# Patient Record
Sex: Female | Born: 1970 | Race: Black or African American | Hispanic: No | Marital: Single | Smoking: Never smoker
Health system: Southern US, Community
[De-identification: ages and names within clinical notes are randomized; demographics above are authoritative.]

## PROBLEM LIST (undated history)

## (undated) DIAGNOSIS — J45909 Unspecified asthma, uncomplicated: Secondary | ICD-10-CM

## (undated) DIAGNOSIS — K219 Gastro-esophageal reflux disease without esophagitis: Secondary | ICD-10-CM

## (undated) DIAGNOSIS — L659 Nonscarring hair loss, unspecified: Secondary | ICD-10-CM

## (undated) DIAGNOSIS — F419 Anxiety disorder, unspecified: Secondary | ICD-10-CM

## (undated) DIAGNOSIS — G4730 Sleep apnea, unspecified: Secondary | ICD-10-CM

## (undated) HISTORY — PX: MASTECTOMY: SHX3

## (undated) HISTORY — PX: KNEE SURGERY: SHX244

## (undated) HISTORY — DX: Nonscarring hair loss, unspecified: L65.9

## (undated) HISTORY — DX: Gastro-esophageal reflux disease without esophagitis: K21.9

## (undated) HISTORY — DX: Unspecified asthma, uncomplicated: J45.909

## (undated) HISTORY — DX: Anxiety disorder, unspecified: F41.9

---

## 2001-04-18 ENCOUNTER — Other Ambulatory Visit: Admission: RE | Admit: 2001-04-18 | Discharge: 2001-04-18 | Payer: Self-pay | Admitting: *Deleted

## 2001-10-31 HISTORY — PX: MYOMECTOMY: SHX85

## 2002-03-04 ENCOUNTER — Other Ambulatory Visit: Admission: RE | Admit: 2002-03-04 | Discharge: 2002-03-04 | Payer: Self-pay | Admitting: *Deleted

## 2002-03-06 ENCOUNTER — Encounter: Admission: RE | Admit: 2002-03-06 | Discharge: 2002-03-06 | Payer: Self-pay | Admitting: *Deleted

## 2002-03-06 ENCOUNTER — Encounter: Payer: Self-pay | Admitting: *Deleted

## 2002-10-09 ENCOUNTER — Encounter (INDEPENDENT_AMBULATORY_CARE_PROVIDER_SITE_OTHER): Payer: Self-pay

## 2002-10-09 ENCOUNTER — Inpatient Hospital Stay (HOSPITAL_COMMUNITY): Admission: RE | Admit: 2002-10-09 | Discharge: 2002-10-11 | Payer: Self-pay | Admitting: Obstetrics and Gynecology

## 2002-10-18 ENCOUNTER — Encounter: Payer: Self-pay | Admitting: Emergency Medicine

## 2002-10-18 ENCOUNTER — Emergency Department (HOSPITAL_COMMUNITY): Admission: EM | Admit: 2002-10-18 | Discharge: 2002-10-18 | Payer: Self-pay | Admitting: Emergency Medicine

## 2003-04-11 ENCOUNTER — Other Ambulatory Visit: Admission: RE | Admit: 2003-04-11 | Discharge: 2003-04-11 | Payer: Self-pay | Admitting: Obstetrics and Gynecology

## 2004-07-02 ENCOUNTER — Encounter: Admission: RE | Admit: 2004-07-02 | Discharge: 2004-07-02 | Payer: Self-pay | Admitting: Obstetrics and Gynecology

## 2005-07-28 ENCOUNTER — Encounter: Admission: RE | Admit: 2005-07-28 | Discharge: 2005-07-28 | Payer: Self-pay | Admitting: Obstetrics and Gynecology

## 2006-08-24 ENCOUNTER — Encounter: Admission: RE | Admit: 2006-08-24 | Discharge: 2006-08-24 | Payer: Self-pay | Admitting: Obstetrics and Gynecology

## 2009-12-08 ENCOUNTER — Emergency Department (HOSPITAL_COMMUNITY): Admission: EM | Admit: 2009-12-08 | Discharge: 2009-12-08 | Payer: Self-pay | Admitting: Family Medicine

## 2011-01-20 LAB — GLUCOSE, CAPILLARY: Glucose-Capillary: 90 mg/dL (ref 70–99)

## 2011-01-20 LAB — POCT I-STAT, CHEM 8
BUN: 8 mg/dL (ref 6–23)
Calcium, Ion: 1.16 mmol/L (ref 1.12–1.32)
Creatinine, Ser: 0.9 mg/dL (ref 0.4–1.2)
HCT: 42 % (ref 36.0–46.0)
Hemoglobin: 14.3 g/dL (ref 12.0–15.0)
Potassium: 3.9 mEq/L (ref 3.5–5.1)

## 2011-01-20 LAB — POCT PREGNANCY, URINE: Preg Test, Ur: NEGATIVE

## 2011-03-18 NOTE — H&P (Signed)
NAME:  Allison Garza, Allison Garza                 ACCOUNT NO.:  [id number]   MEDICAL RECORD NO.:  [id number]                   PATIENT TYPE:  INP   LOCATION:  NA                                   FACILITY:  [facility]   PHYSICIAN:  Maxie Better, M.D.            DATE OF BIRTH:  01/10/1971   DATE OF ADMISSION:  10/09/2002  DATE OF DISCHARGE:                                HISTORY & PHYSICAL   CHIEF COMPLAINT:  Heavy menses, fibroid uterus.   HISTORY OF PRESENT ILLNESS:  This is a 40 year old gravida 1, para 0-0-1-0,  single black female whose last menstrual period is 09/22/02, who with  uterine fibroids is now being admitted for myomectomy secondary to  menorrhagia.  The patient had been having cycles every two weeks, each cycle  lasting seven days with heavy flow the third through the sixth day.  She  underwent ultrasound on 05/17/02, which showed several fibroids, the largest  of which was 3.9 cm, which was posterior fundal.  So __________ component  could not be ruled out.  Therefore, a sonohysterogram infusion showed a  large submucosal fibroid, normal left ovary, small right simple ovarian  cyst, with several other fibroids in the intramural and subserosal area as  well as the posterior low uterine segment area.  The patient was given  counseling regarding her options, including hysteroscopic resection of the  submucosal fibroid, and the patient prefers to proceed with myomectomy.  Information was also given to her regarding uterine artery embolization.   PAST MEDICAL HISTORY:  Depression.   PAST SURGICAL HISTORY:  D&C, left ACL repair.   ALLERGIES:  SHELLFISH/CLAMS.   FAMILY HISTORY:  Positive for hypertension, breast cancer in paternal  grandmother, maternal grandmother, and paternal aunt.  Mother and sister  with fibroids.   PAST OBSTETRICAL HISTORY:  TAB x1.   SOCIAL HISTORY:  Single.  Clinical research associate.  Nonsmoker.   REVIEW OF SYSTEMS:  Negative except as noted in the history of  present  illness.   PHYSICAL EXAMINATION:  GENERAL:  A well-developed, well-nourished black  female in no acute distress.  VITAL SIGNS:  Blood pressure 110/60, pulse of 70, weight is 170 pounds.  SKIN:  Only a small sebaceous cyst on the right cheek.  NECK:  Supple, no palpable mass.  CHEST:  Lungs were clear to auscultation.  BREASTS:  Soft, nontender, no palpable mass.  CARDIAC:  Regular rate and rhythm without murmur.  LYMPHATIC:  No axillary, supraclavicular, or inguinal node palpable.  ABDOMEN:  Soft, nontender.  PELVIC:  Vulva showed no lesions.  Vagina had no discharge.  Cervix was  closed.  Uterus was slight about 10-12 weeks' size, anterior.  Adnexa with  no palpable mass but limited by the uterine size.  RECTAL:  Deferred.  EXTREMITIES:  No edema.   IMPRESSION:  Symptomatic uterine fibroids.   PLAN:  Admission, antibiotic prophylaxis, exploratory laparotomy and  myomectomy.  Risks of the procedure, including but not limited to  infection,  bleeding, possible need for C-section for future deliveries, up to 30%  chance of needing the same surgery or a hysterectomy in the future, internal  scar tissue which may result in pelvic pain and secondary infertility,  possible inability to perform a myomectomy given the finding of  adneomyoma/adenomyosis as the actual etiology for the ultrasound findings,  possible need for blood transfusion with the inherent risks noted including  the risk of infection with hepatitis, HIV transmission, other infections  which may or may not be seen at this time with routine screening of the  blood, a 2% chance of a hysterectomy.  Postop care and criteria for  discharge were reviewed.  Antiembolic stockings.  All questions answered.                                               Maxie Better, M.D.    [place]/MEDQ  D:  10/08/2002  T:  10/09/2002  Job:  [id number]

## 2011-03-18 NOTE — Op Note (Signed)
NAME:  Allison Garza, Allison Garza                 ACCOUNT NO.:  [id number]   MEDICAL RECORD NO.:  [id number]                   PATIENT TYPE:  INP   LOCATION:  9312                                 FACILITY:  [facility]   PHYSICIAN:  Maxie Better, M.D.            DATE OF BIRTH:  [birth year]   DATE OF PROCEDURE:  10/09/2002  DATE OF DISCHARGE:                                 OPERATIVE REPORT   PREOPERATIVE DIAGNOSES:  1. Menometrorrhagia.  2. Uterine fibroids.   POSTOPERATIVE DIAGNOSES:  1. Submucosal/intramural/subserosal fibroids.  2. Menometrorrhagia.   PROCEDURE:  Examination under anesthesia, exploratory laparotomy, multiple  myomectomy.   SURGEON:  Maxie Better, M.D.   ASSISTANT:  Cordelia Pen A. Rosalio Macadamia, M.D.   ANESTHESIA:  General.   INDICATIONS:  This is a 40 year old gravida 1, para 0-0-1-0 single black  female, last menstrual period was September 22, 2002, with symptomatic  uterine fibroids who is now being admitted for a myomectomy.  The risks and  benefits of the procedure have been explained to the patient.  Consent was  signed.  The patient was transferred to the operating room.   DESCRIPTION OF PROCEDURE:  Under adequate general anesthesia, the patient  was placed in the supine position.  Examination under anesthesia revealed an  irregular anteverted 10-12 week size uterus.  No adnexal masses could be  appreciated.  The patient was placed in the frogleg position.  She was  abdominally and vaginally prepped with Hibiclens solution.  An indwelling  Foley catheter was placed sterilely.  A bivalve speculum was placed in the  vagina.  A single-tooth tenaculum was placed on the anterior lip of the  cervix.  An acorn cannula primed with indigo carmine diluted solution was  inserted into the cervical os and attached to the tenaculum for manipulation  of the uterus.  The bivalve speculum was removed.  The patient as then  sterilely draped.  A Pfannenstiel skin incision  was then made after 0.25%  Marcaine was injected along the planned incision line.  The incision was  carried down to the rectus fascia.  The rectus fascia was incised in the  midline with Bovie cautery and extended bilaterally.  The rectus fascia was  then bluntly and with cautery dissected off the rectus muscles, superior and  inferior fascia.  The rectus muscles were split in the midline.  The  parietal peritoneum was entered sharply and extended superiorly and  inferiorly.  Exploration of the upper abdomen was notable for a normal  palpable liver edge, normal palpable kidneys.  Attention was then turned to  the pelvis where the uterus was examined and had multiple fibroids making  the uterus about 10 weeks' size.  Both ovaries were normal.  The tubes were  both tortuous, left greater than right with a question of salpingitis,  isthmica nodosa.  The bowels were packed upwardly.  A self-retaining  retractor was then placed, and a pediatric bladder blade was then utilized.  Further  inspection of the uterus was notable for three palpable anterior  soft serosal fibroids noted posteriorly.  On both cornual regions were  fibroids ranging from 3-4 cm.  In addition on the left lower uterine segment  posteriorly was at least a 3 cm fibroid.  There were other pea-sized small  pedunculated fibroids noted posteriorly and anteriorly.  A dilute solution  of Pitressin was injected overlying the posterior serosa of the fibroid  posteriorly.  Using needle point cautery vertical incision was made over the  right posterior cornual fibroid.  This was then enucleated from its base,  and the methylene blue/indigo carmine fluid was injected into the uterus and  confirmed that the endometrial cavity was entered at that point.  No  additional fibroids could be palpated in the base of that dead space from  the removal of the fibroid, and the area overlying the endometrial tissue  was closed with 3-0 Vicryl suture  in a running stitch, and the other  remaining areas closed with 0 Vicryl.  The serosa of the dead space was then  closed with 2-0 Monocryl suture in a baseball fashion.  Attention was then  turned to the additional posterior fibroids, and again incision was made  overlying the fibroids.  They were enucleated from their base and were  closed with 0 Vicryl suture until the serosa of the uterus was reached and  then 2-0 Monocryl was then utilized.  Attention was then turned to the  anterior fibroids.  Again, Pitressin was injected.  A small vertical  incision was then made between both fibroids and through that incision four  fibroids were then removed.  The dead space was then closed with 0 Vicryl  figure-of-eight sutures and the serosa again approximated using 2-0 Monocryl  suture.  Both ovaries were normal.  The right fallopian tube had small  paratubal cysts which were not removed.  The uterine cavity was again  injected with the dye solution to see what the status of the fallopian tubes  were.  However, the dye was coming through the previous closed myoma space  on the right posterior area, and therefore, the procedure was not continued.  The abdomen was then irrigated and suctioned of debris.  The packings were  then removed.  The Interceed was then placed posteriorly and anteriorly  overlying the incision sites and the retaininig retractor was removed.  The  rectus muscles were inspected.  Small bleeders cauterized.  The rectus  fascia was then closed with 0 Vicryl x2.  The subcutaneous area was  irrigated and suctioned.  Small bleeders were cauterized.  The skin was  approximated using 4-0 Monocryl subcuticular stitch and Dermabond.  Specimen  was myoma x8.  Estimated blood loss was 100 cc.  Intraoperative fluids were  2400 cc crystalloid.  Urine output was 350 cc of bluish tinged urine.  Sponge and instrument counts x2 were correct.  The instruments from the vagina were removed.   Complications were none.  The patient tolerated the  procedure well and was transferred to the recovery room in stable condition.  She will need a cesarean section for future deliveries.                                               Maxie Better, M.D.    [place]/MEDQ  D:  10/09/2002  T:  10/09/2002  Job:  [id number]

## 2011-03-18 NOTE — Discharge Summary (Signed)
   NAME:  Allison Garza, Allison Garza                 ACCOUNT NO.:  [id number]   MEDICAL RECORD NO.:  [id number]                  PATIENT TYPE:  INP   LOCATION:  9312                                 FACILITY:  [facility]   PHYSICIAN:  Maxie Better, M.D.            DATE OF BIRTH:  [birth year]   DATE OF ADMISSION:  10/09/2002  DATE OF DISCHARGE:  10/11/2002                                 DISCHARGE SUMMARY   ADMISSION DIAGNOSES:  1. Menometrorrhagia.  2. Uterine fibroids.   DISCHARGE DIAGNOSES:  1. Menometrorrhagia.  2. Submucosal/intramural/subserosal fibroids.  3. Iron-deficiency anemia.   PROCEDURES:  1. Exploratory laparotomy.  2. Multiple myomectomy.   HISTORY OF PRESENT ILLNESS:  This is a 40 year old gravida 1, para 0-0-0-1,  female with symptomatic uterine fibroids admitted for surgical management.   HOSPITAL COURSE:  The patient was admitted on October 09, 2002.  She was  taken to the operating room where she underwent an exploratory laparotomy  and multiple myomectomy.  A total of eight fibroids were removed.  The  initial findings were possible salpingitis, isthmica nodosa of the left,  greater than right, tube and right paratubal cyst.  The endometrial cavity  was entered so this patient is a cesarean section for future deliveries.   She had an uncomplicated postoperative course.  She had a temperature  maximum of 100.5 which subsequently defervesced spontaneously.  Her CBC on  postoperative day #1 showed hemoglobin 9, hematocrit 27, white count 8.5,  platelet count 273,000.  Her preoperative hemoglobin was 10.9.   By postoperative day #2, the patient was tolerating a regular diet and  deemed well to be discharged home.  Her incision had no erythema,  induration, or exudate.   DISPOSITION:  Home.   CONDITION ON DISCHARGE:  Stable.   FOLLOWUP:  In four weeks at [facility].   DISCHARGE INSTRUCTIONS:  1. Call for a temperature greater or equal  to 100.4.  2. Nothing per vagina for 4-6 weeks.  3. No heavy lifting or driving for two weeks.  4. Call for abdominal pain, nausea and vomiting, heavy bleeding per vagina,     incisional bleeding or drainage, or increased incisional pain.   DISCHARGE MEDICATIONS:  1. Tylox 25 mg 1-2 tablets q.3-4 h. p.r.n. pain.  2. Motrin 800 mg one p.o. q.6-8 h.  3. Ferrous sequel one p.o. daily.                                               Maxie Better, M.D.    /MEDQ  D:  11/29/2002  T:  11/29/2002  Job:  [id number]

## 2013-03-29 DIAGNOSIS — F419 Anxiety disorder, unspecified: Secondary | ICD-10-CM | POA: Insufficient documentation

## 2013-03-29 DIAGNOSIS — R0989 Other specified symptoms and signs involving the circulatory and respiratory systems: Secondary | ICD-10-CM | POA: Insufficient documentation

## 2013-03-29 DIAGNOSIS — F32A Depression, unspecified: Secondary | ICD-10-CM | POA: Insufficient documentation

## 2013-03-29 DIAGNOSIS — F329 Major depressive disorder, single episode, unspecified: Secondary | ICD-10-CM | POA: Insufficient documentation

## 2013-03-29 DIAGNOSIS — G4733 Obstructive sleep apnea (adult) (pediatric): Secondary | ICD-10-CM | POA: Insufficient documentation

## 2016-04-06 ENCOUNTER — Encounter (HOSPITAL_COMMUNITY): Payer: Self-pay | Admitting: Emergency Medicine

## 2016-04-06 ENCOUNTER — Ambulatory Visit (HOSPITAL_COMMUNITY)
Admission: EM | Admit: 2016-04-06 | Discharge: 2016-04-06 | Disposition: A | Discharge status: Home / Self Care | Payer: BLUE CROSS/BLUE SHIELD | Attending: Emergency Medicine | Admitting: Emergency Medicine

## 2016-04-06 DIAGNOSIS — T541X1A Toxic effect of other corrosive organic compounds, accidental (unintentional), initial encounter: Secondary | ICD-10-CM

## 2016-04-06 DIAGNOSIS — H10211 Acute toxic conjunctivitis, right eye: Secondary | ICD-10-CM

## 2016-04-06 HISTORY — DX: Sleep apnea, unspecified: G47.30

## 2016-04-06 MED ORDER — TETRACAINE HCL 0.5 % OP SOLN
OPHTHALMIC | Status: AC
  Filled 2016-04-06: qty 2

## 2016-04-06 MED ORDER — EYE WASH OPHTH SOLN
OPHTHALMIC | Status: AC
  Filled 2016-04-06: qty 118

## 2016-04-06 NOTE — ED Notes (Signed)
The patient presented to the [facility] with a complaint of right eye irritation and burning. The patient stated that earlier today she was cleaning her contacts and did not rinse the cleaning solution off prior to inserting the contact. She stated that she got an immediate burning sensation and was able to remove the contact and flush her eye with water.

## 2016-04-06 NOTE — ED Provider Notes (Signed)
CSN: [place]:[id number]     Arrival date & time 04/06/16  1816 History   First MD Initiated Contact with Patient 04/06/16 1905     Chief Complaint  Patient presents with  . Eye Problem   (Consider location/radiation/quality/duration/timing/severity/associated sxs/prior Treatment) HPI Comments: 45 year old female washed her right contact lens and 83% hydrogen peroxide solution and then placed in her eye this afternoon. She immediately felt some discomfort and removed the contact lands realizing what she had done. She then irrigated her eye. She is complaining of mild discomfort at this time. Denies change in vision.  Patient is a 45 y.o. female presenting with eye problem.  Eye Problem Associated symptoms: no discharge, no itching and no photophobia     Past Medical History  Diagnosis Date  . Sleep apnea    Past Surgical History  Procedure Laterality Date  . Mastectomy    . Knee surgery     Family History  Problem Relation Age of Onset  . Diabetes Mother   . Hypertension Mother   . Heart failure Father    Social History  Substance Use Topics  . Smoking status: Never Smoker   . Smokeless tobacco: Never Used  . Alcohol Use: Yes     Comment: occasionally   OB History    No data available     Review of Systems  Constitutional: Negative.   HENT: Negative.   Eyes: Negative for photophobia, discharge, itching and visual disturbance.  Respiratory: Negative.   All other systems reviewed and are negative.   Allergies  Penicillins  Home Medications   Prior to Admission medications   Not on File   Meds Ordered and Administered this Visit  Medications - No data to display  BP 107/66 mmHg  Pulse 64  Temp(Src) 98.7 F (37.1 C) (Oral)  Resp 16  SpO2 100% No data found.   Physical Exam  Constitutional: She is oriented to person, place, and time. She appears well-developed and well-nourished. No distress.  Eyes: EOM are normal. Pupils are equal, round, and reactive to  light. Right eye exhibits no discharge. Left eye exhibits no discharge.  Minor conjunctival and scleral erythema. Anterior chamber is clear. Under magnification no apparent defects, foreign bodies or injury.  Neck: Normal range of motion. Neck supple.  Cardiovascular: Normal rate.   Pulmonary/Chest: Effort normal.  Neurological: She is alert and oriented to person, place, and time.  Skin: Skin is warm and dry.  Psychiatric: She has a normal mood and affect.  Nursing note and vitals reviewed.   ED Course  Procedures (including critical care time)  Labs Review Labs Reviewed - No data to display  Imaging Review No results found.   Visual Acuity Review  Right Eye Distance: 20/25 (With corrective lenses) Left Eye Distance: 20/20 (With corrective lenses) Bilateral Distance: 20/20 (With corrective lenses)  Right Eye Near:   Left Eye Near:    Bilateral Near:      Procedure: 2 tetracaine drops were placed into the right eye. This was followed by irrigation of 120 cc of eyewash.     MDM   1. Chemical conjunctivitis of right eye    May use Zaditor eye drops 1 drop right eye twice daily for redness, inflammation and discomfort. See your eye doctor tomorrow if still having symptoms or worse.     Janne Napoleon, NP 04/06/16 1941

## 2016-04-06 NOTE — Discharge Instructions (Signed)
Chemical Conjunctivitis May use Zaditor eye drops 1 drop right eye twice daily for redness, inflammation and discomfort. See your eye doctor tomorrow if still having symptoms or worse. Chemical conjunctivitis is eye inflammation from exposure to an irritant or chemical substance. This causes the clear membrane that covers the white part of your eye and the inner surface of your eyelid (conjunctiva) to become inflamed. When the blood vessels in the conjunctiva become inflamed, the eye may become red, pink, and itchy. Chemical conjunctivitis can occur in one or both eyes. It cannot be spread by one person to another person (noncontagious). CAUSES This condition is caused by exposure to a chemical substance or irritant, such as:  Smoke.  Chlorine.  Soap.  Fumes.  Air pollution. RISK FACTORS This condition is more likely to develop in:  People who live somewhere with high levels of air pollution.  People who use swimming pools often. SYMPTOMS Symptoms of this condition may include:  Eye redness.  Tearing of the eyes.  Watery eyes.  Itchy eyes.  Burning feeling in the eyes.  Clear drainage from the eyes.  Swollen eyelids.  Sensitivity to light. DIAGNOSIS Your health care provider can diagnose this condition from your symptoms and medical history. The health care provider will also do a physical exam. If you have drainage from your eyes, it may be tested to rule out other causes of conjunctivitis. Your health care provider may also use a medical instrument that uses magnified light to examine the eyes (slit lamp).  TREATMENT Treatment for this condition involves carefully flushing the chemical out of your eye. You may also get antiallergy medicines or eye drops to use at home. HOME CARE INSTRUCTIONS  Take or apply medicines only as directed by your health care provider.  Do not touch or rub your eyes.  Do not wear contact lenses until the inflammation is gone. Wear  glasses instead.  Do not wear eye makeup until the inflammation is gone.  Apply a cool, clean washcloth to your eye for 10-20 minutes, 3-4 times a day.  Avoid exposure to the chemical or environment that caused the irritation. Wear eye protection as necessary. SEEK MEDICAL CARE IF:  Your symptoms get worse.  You have pus draining from your eye.  You have new symptoms.  You have a fever.  You have a change in vision.  You have increasing pain.   This information is not intended to replace advice given to you by your health care provider. Make sure you discuss any questions you have with your health care provider.   Document Released: 07/27/2005 Document Revised: 11/07/2014 Document Reviewed: 07/29/2014 Elsevier Interactive Patient Education [employer].

## 2016-04-13 ENCOUNTER — Ambulatory Visit (HOSPITAL_COMMUNITY)
Admission: RE | Admit: 2016-04-13 | Discharge: 2016-04-13 | Disposition: A | Discharge status: Home / Self Care | Payer: BLUE CROSS/BLUE SHIELD | Source: Ambulatory Visit | Attending: Emergency Medicine | Admitting: Emergency Medicine

## 2016-04-13 ENCOUNTER — Ambulatory Visit (INDEPENDENT_AMBULATORY_CARE_PROVIDER_SITE_OTHER): Payer: BLUE CROSS/BLUE SHIELD

## 2016-04-13 ENCOUNTER — Ambulatory Visit (INDEPENDENT_AMBULATORY_CARE_PROVIDER_SITE_OTHER): Payer: BLUE CROSS/BLUE SHIELD | Admitting: Emergency Medicine

## 2016-04-13 VITALS — BP 120/78 | HR 56 | Temp 98.0°F | Resp 16 | Ht 66.0 in | Wt 205.2 lb

## 2016-04-13 DIAGNOSIS — M25475 Effusion, left foot: Secondary | ICD-10-CM

## 2016-04-13 DIAGNOSIS — M79662 Pain in left lower leg: Secondary | ICD-10-CM

## 2016-04-13 MED ORDER — BETAMETHASONE DIPROPIONATE AUG 0.05 % EX CREA: TOPICAL_CREAM | Freq: Two times a day (BID) | CUTANEOUS | Status: DC

## 2016-04-13 NOTE — Patient Instructions (Addendum)
Apply ice to your  foot twice a day. Do not do any weightbearing exercises until Monday. Please go have your US done Take Zyrtec or Claritin 1 a day for swelling.     IF you received an x-ray today, you will receive an invoice from [facility]. Please contact [facility] at [phone] with questions or concerns regarding your invoice.   IF you received labwork today, you will receive an invoice from [employer]. Please contact [facility] at [phone] with questions or concerns regarding your invoice.   Our billing staff will not be able to assist you with questions regarding bills from these companies.  You will be contacted with the lab results as soon as they are available. The fastest way to get your results is to activate your My Chart account. Instructions are located on the last page of this paperwork. If you have not heard from [place] regarding the results in 2 weeks, please contact this office.

## 2016-04-13 NOTE — Progress Notes (Addendum)
Patient ID: Allison Garza, female   DOB: 1971/05/14, 45 y.o.   MRN: [id number]    By signing my name below, I, Essence Howell, attest that this documentation has been prepared under the direction and in the presence of Darlyne Russian, MD Electronically Signed: Ladene Artist, ED Scribe 04/13/2016 at 9:31 AM.  Chief Complaint:  Chief Complaint  Patient presents with  . Foot Swelling    left foot swelling with redness. Per pt foot is painful but the redness is better than it was yesterday   . OTHER    Pt does work out with a Clinical research associate, went to a bootcamp over the weekend but no falls no injury    HPI: Allison Garza is a 45 y.o. female who reports to [facility] today complaining of gradually improving left foot swelling and redness first noticed 2 days ago. Pt states that she works out with a trainer x2 days/week but denies fall or injury. She suspects that she may have been stung by an unknown insect but does not recall feeling the initial sting. She reports associated itching to the area and pain that she describes as tingling and soreness. No treatments tried PTA. No personal h/o DVT/PE but pt's mother has a h/o DVT.   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   Past Medical History  Diagnosis Date  . Sleep apnea    Past Surgical History  Procedure Laterality Date  . Mastectomy    . Knee surgery     Social History   Social History  . Marital Status: Single    Spouse Name: N/A  . Number of Children: N/A  . Years of Education: N/A   Social History Main Topics  . Smoking status: Never Smoker   . Smokeless tobacco: Never Used  . Alcohol Use: Yes     Comment: occasionally  . Drug Use: No  . Sexual Activity: Not Asked   Other Topics Concern  . None   Social History Narrative   Family History  Problem Relation Age of Onset  . Diabetes Mother   . Hypertension Mother   . Heart failure Father    Allergies  Allergen Reactions  . Penicillins   .  Shellfish Allergy Hives   Prior to Admission medications   Not on File   ROS: The patient denies fevers, chills, night sweats, unintentional weight loss, chest pain, palpitations, wheezing, dyspnea on exertion, nausea, vomiting, abdominal pain, dysuria, hematuria, melena, numbness, weakness.   All other systems have been reviewed and were otherwise negative with the exception of those mentioned in the HPI and as above.    PHYSICAL EXAM: Filed Vitals:   04/13/16 0846  BP: 120/78  Pulse: 56  Temp: 98 F (36.7 C)  Resp: 16   Body mass index is 33.14 kg/(m^2).   General: Alert, no acute distress HEENT:  Normocephalic, atraumatic, oropharynx patent. Eye: Juliette Mangle [facility] Cardiovascular:  Regular rate and rhythm, no rubs murmurs or gallops.  No Carotid bruits, radial pulse intact. No pedal edema.  Respiratory: Clear to auscultation bilaterally.  No wheezes, rales, or rhonchi.  No cyanosis, no use of accessory musculature Abdominal: No organomegaly, abdomen is soft and non-tender, positive bowel sounds.  No masses. Musculoskeletal: Gait intact. Mild tenderness over the lower L calf. Significant swelling over the dorsum of the L foot. Mild redness with increased warmth. There appears to be a 1-2 mm puncture area to the distal dorsum area of the foot in the midline.  Skin: No rashes. Neurologic: Facial musculature symmetric. Psychiatric: Patient acts appropriately throughout our interaction. Lymphatic: No cervical or submandibular lymphadenopathy  LABS:  EKG/XRAY:   Primary read interpreted by Dr. Everlene Farrier at [facility]. Dg Foot Complete Left  04/13/2016  CLINICAL DATA:  Swelling of foot joint EXAM: LEFT FOOT - COMPLETE 3+ VIEW COMPARISON:  None. FINDINGS: There is diffuse soft tissue swelling. Hallux valgus deformity is noted. There is moderate joint space narrowing involving the first MTP joint. There is no fracture or subluxation identified. There are moderate degenerative changes involving the  first MTP joint along with a hallux valgus deformity. IMPRESSION: 1. Diffuse soft tissue swelling. 2. 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 Electronically Signed   By: Kerby Moors M.D.   On: 04/13/2016 09:23   ASSESSMENT/PLAN: X-ray shows diffuse soft tissue swelling. Will treat with ice elevation and  cortisone cream. This should improve over the next 24-48 hours to call if worsening. Will do an ultrasound to be sure there is no evidence of clot. There is an outside chance this is a cellulitis but patient has not been sick she has not run fever and really no reason to have a cellulitis. She knows to call if she has worsening or increased redness or progressive redness up into her ankle area. Ultrasound returned normal   Gross sideeffects, risk and benefits, and alternatives of medications d/w patient. Patient is aware that all medications have potential sideeffects and we are unable to predict every sideeffect or drug-drug interaction that may occur.  Arlyss Queen MD 04/13/2016 8:57 AM

## 2016-04-13 NOTE — Progress Notes (Signed)
*  PRELIMINARY RESULTS* Vascular Ultrasound Left lower extremity venous duplex has been completed.  Preliminary findings: No evidence of DVT or baker's cyst.    Called results to [facility] [place], RVT  04/13/2016, 11:17 AM

## 2016-04-14 ENCOUNTER — Telehealth: Payer: Self-pay | Admitting: Emergency Medicine

## 2016-04-14 ENCOUNTER — Other Ambulatory Visit: Payer: Self-pay | Admitting: Emergency Medicine

## 2016-04-14 DIAGNOSIS — L03119 Cellulitis of unspecified part of limb: Secondary | ICD-10-CM

## 2016-04-14 MED ORDER — DOXYCYCLINE HYCLATE 100 MG PO TABS: 100.0000 mg | ORAL_TABLET | Freq: Two times a day (BID) | ORAL | Status: DC

## 2016-04-14 NOTE — Progress Notes (Signed)
I called and spoke with patient. We'll go ahead and cover for cellulitis with doxycycline. She will call us on Saturday with a phone update.

## 2016-04-14 NOTE — Telephone Encounter (Signed)
I called and spoke with patient. She did have a lymph node present on the ultrasound. She continues to have no fever but swelling is persistent with pain on walking. She is placed on doxycycline twice a day with follow-up phone call on Saturday. She will return to clinic Monday if not experiencing significant improvement.

## 2016-04-18 ENCOUNTER — Ambulatory Visit (INDEPENDENT_AMBULATORY_CARE_PROVIDER_SITE_OTHER): Payer: BLUE CROSS/BLUE SHIELD | Admitting: Emergency Medicine

## 2016-04-18 VITALS — BP 114/72 | HR 77 | Temp 98.2°F | Resp 18 | Ht 66.0 in | Wt 205.4 lb

## 2016-04-18 DIAGNOSIS — M79662 Pain in left lower leg: Secondary | ICD-10-CM | POA: Diagnosis not present

## 2016-04-18 DIAGNOSIS — M25475 Effusion, left foot: Secondary | ICD-10-CM | POA: Diagnosis not present

## 2016-04-18 NOTE — Progress Notes (Signed)
Subjective:  This chart was scribed for Arlyss Queen MD, by Tamsen Roers, at [facility].  This patient was seen in room 8 and the patient's care was started at 10:31 AM.   Chief Complaint  Patient presents with  . Follow-up    foot issues     Patient ID: Allison Garza, female    DOB: 1971/04/12, 45 y.o.   MRN: [id number]  HPI HPI Comments: Allison Garza is a 45 y.o. female who presents to the [facility] for a follow up regarding her left foot swelling and redness which she was seen for here on 6/14.  Patient had an ultrasound completed which showed no signs of a DVT or bakers cyst.  Patient states that her swelling has gotten better and she is able to bear weight on it..  She missed 1 dose of antibiotics last night. Patient has agreed to hold back from impact sports while waiting for her foot to heal.  She has a trainer currently and is exercising frequently.     There are no active problems to display for this patient.  Past Medical History  Diagnosis Date  . Sleep apnea    Past Surgical History  Procedure Laterality Date  . Mastectomy    . Knee surgery     Allergies  Allergen Reactions  . Penicillins   . Shellfish Allergy Hives   Prior to Admission medications   Medication Sig Start Date End Date Taking? Authorizing Provider  augmented betamethasone dipropionate (DIPROLENE AF) 0.05 % cream Apply topically 2 (two) times daily. 04/13/16  Yes Darlyne Russian, MD  doxycycline (VIBRA-TABS) 100 MG tablet Take 1 tablet (100 mg total) by mouth 2 (two) times daily. 04/14/16  Yes Darlyne Russian, MD   Social History   Social History  . Marital Status: Single    Spouse Name: N/A  . Number of Children: N/A  . Years of Education: N/A   Occupational History  . Not on file.   Social History Main Topics  . Smoking status: Never Smoker   . Smokeless tobacco: Never Used  . Alcohol Use: Yes     Comment: occasionally    . Drug Use: No  . Sexual Activity: Not on file   Other Topics Concern  . Not on file   Social History Narrative       Review of Systems  Constitutional: Negative for fever and chills.  Eyes: Negative for pain, redness and itching.  Respiratory: Negative for cough and shortness of breath.   Musculoskeletal: Negative for neck pain and neck stiffness.  Skin: Negative for color change.  Neurological: Negative for seizures, syncope and speech difficulty.       Objective:   Physical Exam  Filed Vitals:   04/18/16 0905  BP: 114/72  Pulse: 77  Temp: 98.2 F (36.8 C)  TempSrc: Oral  Resp: 18  Height: 5\' 6"  (1.676 m)  Weight: 205 lb 6.4 oz (93.169 kg)  SpO2: 99%     CONSTITUTIONAL: Well developed/well nourished HEAD: Normocephalic/atraumatic EYES: EOMI/PERRL SPINE/BACK:entire spine nontender NEURO: Pt is awake/alert/appropriate, moves all extremitiesx4.  No facial droop.   EXTREMITIES: left foot:  there is minimal swelling over the top of the left foot. the redness and increased warmth has resolved. SKIN: warm, color normal PSYCH: no abnormalities of mood noted, alert and oriented to situation       Assessment & Plan:  Patient is doing well. We'll go ahead  and have her finish her doxycycline for a 7 day course. She is released to return exercise but no impact exercise for 1 more week. I told her if her swelling resumed when she resumed impact exercise we also have to consider a stress fracture.I personally performed the services described in this documentation, which was scribed in my presence. The recorded information has been reviewed and is accurate.  Patient is doing well.  She is currently finishing a course of doxycycline and still unclear as to the cause. This definitely could have been a bite.  Her doppler did not reveal a clot.  She is to abstain from impact exercise for one more week. If when she returns to exercise, her foot swells again we need to reconsider  a possible stress fracture.

## 2016-04-18 NOTE — Patient Instructions (Signed)
     IF you received an x-ray today, you will receive an invoice from [facility]. Please contact [facility] at [phone] with questions or concerns regarding your invoice.   IF you received labwork today, you will receive an invoice from [facility]. Please contact [facility] at [phone] with questions or concerns regarding your invoice.   Our billing staff will not be able to assist you with questions regarding bills from these companies.  You will be contacted with the lab results as soon as they are available. The fastest way to get your results is to activate your My Chart account. Instructions are located on the last page of this paperwork. If you have not heard from us regarding the results in 2 weeks, please contact this office.      

## 2017-03-28 ENCOUNTER — Ambulatory Visit (INDEPENDENT_AMBULATORY_CARE_PROVIDER_SITE_OTHER): Payer: BLUE CROSS/BLUE SHIELD | Admitting: Family Medicine

## 2017-03-28 ENCOUNTER — Encounter: Payer: Self-pay | Admitting: Family Medicine

## 2017-03-28 VITALS — BP 117/80 | HR 70 | Temp 98.8°F | Resp 18 | Ht 66.0 in | Wt 204.2 lb

## 2017-03-28 DIAGNOSIS — J069 Acute upper respiratory infection, unspecified: Secondary | ICD-10-CM | POA: Diagnosis not present

## 2017-03-28 MED ORDER — FLUTICASONE PROPIONATE 50 MCG/ACT NA SUSP: 2.0000 | Freq: Every day | NASAL | 6 refills | Status: DC

## 2017-03-28 MED ORDER — AZITHROMYCIN 250 MG PO TABS: ORAL_TABLET | ORAL | 0 refills | Status: DC

## 2017-03-28 NOTE — Progress Notes (Signed)
   Chief Complaint  Patient presents with  . Sore Throat    x 3days  . Nasal Congestion  . Chills  . Generalized Body Aches  . Cough    x 1day     HPI  Pt reports that she has sore throat for 3 days with nasal congestion, chills, generalized body aches, one day of cough She is working on her dissertation No sick contacts Denies nausea or vomiting  Past Medical History:  Diagnosis Date  . Sleep apnea     Current Outpatient Prescriptions  Medication Sig Dispense Refill  . augmented betamethasone dipropionate (DIPROLENE AF) 0.05 % cream Apply topically 2 (two) times daily. 30 g 0  . azithromycin (ZITHROMAX) 250 MG tablet Take 2 tablets on day 1, each day after just one pill. 6 tablet 0  . fluticasone (FLONASE) 50 MCG/ACT nasal spray Place 2 sprays into both nostrils daily. 16 g 6   No current facility-administered medications for this visit.     Allergies:  Allergies  Allergen Reactions  . Penicillins   . Shellfish Allergy Hives    Past Surgical History:  Procedure Laterality Date  . KNEE SURGERY    . MASTECTOMY      Social History   Social History  . Marital status: Single    Spouse name: N/A  . Number of children: N/A  . Years of education: N/A   Social History Main Topics  . Smoking status: Never Smoker  . Smokeless tobacco: Never Used  . Alcohol use Yes     Comment: occasionally  . Drug use: No  . Sexual activity: Not Asked   Other Topics Concern  . None   Social History Narrative  . None    ROS See hpi  Objective: Vitals:   03/28/17 1326  BP: 117/80  Pulse: 70  Resp: 18  Temp: 98.8 F (37.1 C)  TempSrc: Oral  SpO2: 100%  Weight: 204 lb 3.2 oz (92.6 kg)  Height: 5\' 6"  (1.676 m)    Physical Exam General: alert, oriented, in NAD Head: normocephalic, atraumatic, no sinus tenderness Eyes: EOM intact, no scleral icterus or conjunctival injection Ears: TM clear bilaterally Nose: mucosa nonerythematous, nonedematous Throat: no  pharyngeal exudate or erythema Lymph: no posterior auricular, submental or cervical lymph adenopathy Heart: normal rate, normal sinus rhythm, no murmurs Lungs: clear to auscultation bilaterally, no wheezing   Assessment and Plan Allison Garza was seen today for sore throat, nasal congestion, chills, generalized body aches and cough.  Diagnoses and all orders for this visit:  Acute URI Supportive care Advised zpak if symptoms worsen (pcn allergic) -     azithromycin (ZITHROMAX) 250 MG tablet; Take 2 tablets on day 1, each day after just one pill. -     fluticasone (FLONASE) 50 MCG/ACT nasal spray; Place 2 sprays into both nostrils daily.     [place]

## 2017-03-28 NOTE — Patient Instructions (Addendum)
  At home  Take flonase in the shower by flexing your neck and spraying each nostril once a day  Drink plenty of water  Take theraflu or mucinex sinus  If you have fevers or chills, green mucus or worsening symptoms take the zpak.    IF you received an x-ray today, you will receive an invoice from [facility]. Please contact [facility] at [phone] with questions or concerns regarding your invoice.   IF you received labwork today, you will receive an invoice from [facility]. Please contact [facility] at [phone] with questions or concerns regarding your invoice.   Our billing staff will not be able to assist you with questions regarding bills from these companies.  You will be contacted with the lab results as soon as they are available. The fastest way to get your results is to activate your My Chart account. Instructions are located on the last page of this paperwork. If you have not heard from [place] regarding the results in 2 weeks, please contact this office.

## 2017-07-28 ENCOUNTER — Encounter: Payer: Self-pay | Admitting: Gastroenterology

## 2017-09-26 ENCOUNTER — Encounter: Payer: Self-pay | Admitting: Gastroenterology

## 2017-09-26 ENCOUNTER — Ambulatory Visit (INDEPENDENT_AMBULATORY_CARE_PROVIDER_SITE_OTHER): Payer: BLUE CROSS/BLUE SHIELD | Admitting: Gastroenterology

## 2017-09-26 VITALS — BP 108/62 | HR 70 | Ht 66.0 in | Wt 209.0 lb

## 2017-09-26 DIAGNOSIS — Z800 Family history of malignant neoplasm of digestive organs: Secondary | ICD-10-CM

## 2017-09-26 DIAGNOSIS — K5909 Other constipation: Secondary | ICD-10-CM | POA: Diagnosis not present

## 2017-09-26 MED ORDER — NA SULFATE-K SULFATE-MG SULF 17.5-3.13-1.6 GM/177ML PO SOLN: 1.0000 | Freq: Once | ORAL | 0 refills | Status: AC

## 2017-09-26 NOTE — Patient Instructions (Addendum)
You have been scheduled for a colonoscopy. Please follow written instructions given to you at your visit today.  Please pick up your prep supplies at the pharmacy within the next 1-3 days. If you use inhalers (even only as needed), please bring them with you on the day of your procedure.   Take Benefiber 1 tablespoon twice a day with meals  Increase fluid intake to 8-10 cups per day  Constipation, Adult Constipation is when a person has fewer bowel movements in a week than normal, has difficulty having a bowel movement, or has stools that are dry, hard, or larger than normal. Constipation may be caused by an underlying condition. It may become worse with age if a person takes certain medicines and does not take in enough fluids. Follow these instructions at home: Eating and drinking   Eat foods that have a lot of fiber, such as fresh fruits and vegetables, whole grains, and beans.  Limit foods that are high in fat, low in fiber, or overly processed, such as french fries, hamburgers, cookies, candies, and soda.  Drink enough fluid to keep your urine clear or pale yellow. General instructions  Exercise regularly or as told by your health care provider.  Go to the restroom when you have the urge to go. Do not hold it in.  Take over-the-counter and prescription medicines only as told by your health care provider. These include any fiber supplements.  Practice pelvic floor retraining exercises, such as deep breathing while relaxing the lower abdomen and pelvic floor relaxation during bowel movements.  Watch your condition for any changes.  Keep all follow-up visits as told by your health care provider. This is important. Contact a health care provider if:  You have pain that gets worse.  You have a fever.  You do not have a bowel movement after 4 days.  You vomit.  You are not hungry.  You lose weight.  You are bleeding from the anus.  You have thin, pencil-like  stools. Get help right away if:  You have a fever and your symptoms suddenly get worse.  You leak stool or have blood in your stool.  Your abdomen is bloated.  You have severe pain in your abdomen.  You feel dizzy or you faint. This information is not intended to replace advice given to you by your health care provider. Make sure you discuss any questions you have with your health care provider. Document Released: 07/15/2004 Document Revised: 05/06/2016 Document Reviewed: 04/06/2016 Elsevier Interactive Patient Education  2017 [employer].

## 2017-09-26 NOTE — Progress Notes (Addendum)
Allison Garza    [id number]    [birth year]  Primary Care Physician:Metz, Barbaraann Share, MD  Referring Physician: Nicoletta Dress, MD [place], [place] [address] Chief complaint: Family history of colon cancer  HPI: 46 year old female here to discuss colon cancer screening.  She has a family history of colon cancer.  Her mother has history of multiple colon polyps, last year she had to undergo surgery due to a large polyp.  She does not know the final pathology, does not think she had cancer.  Her mother's brother had colon cancer. Paternal aunt had colon cancer in her 68s  Denies any dysphagia, heartburn, nausea, vomiting, abdominal pain, melena or bright red blood per rectum.  She has intermittent constipation, improves when she drinks lots of coffee. 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  Biopsies showed increased intraepithelial lymphocytes without villous blunting , nonspecific  for celiac.  She is currently not restricting her diet.  She was also taking Plaquenil at the time for joint pain, discontinued it years ago.   Outpatient Encounter Medications as of 09/26/2017  Medication Sig  . [DISCONTINUED] augmented betamethasone dipropionate (DIPROLENE AF) 0.05 % cream Apply topically 2 (two) times daily.  . [DISCONTINUED] azithromycin (ZITHROMAX) 250 MG tablet Take 2 tablets on day 1, each day after just one pill.  . [DISCONTINUED] fluticasone (FLONASE) 50 MCG/ACT nasal spray Place 2 sprays into both nostrils daily.   No facility-administered encounter medications on file as of 09/26/2017.     Allergies as of 09/26/2017 - Review Complete 09/26/2017  Allergen Reaction Noted  . Penicillins  04/06/2016  . Shellfish allergy Hives 04/13/2016    Past Medical History:  Diagnosis Date  . Anxiety   . GERD  (gastroesophageal reflux disease)   . Sleep apnea     Past Surgical History:  Procedure Laterality Date  . KNEE SURGERY    . MASTECTOMY      Family History  Problem Relation Age of Onset  . Diabetes Mother   . Hypertension Mother   . Heart failure Father   . Colon cancer Paternal Aunt   . Colon cancer Maternal Uncle     Social History   Socioeconomic History  . Marital status: Single    Spouse name: Not on file  . Number of children: Not on file  . Years of education: Not on file  . Highest education level: Not on file  Social Needs  . Financial resource strain: Not on file  . Food insecurity - worry: Not on file  . Food insecurity - inability: Not on file  . Transportation needs - medical: Not on file  . Transportation needs - non-medical: Not on file  Occupational History  . Not on file  Tobacco Use  . Smoking status: Never Smoker  . Smokeless tobacco: Never Used  Substance and Sexual Activity  . Alcohol use: Yes    Comment: occasionally  . Drug use: No  . Sexual activity: Not on file  Other Topics Concern  . Not on file  Social History Narrative  . Not on file      Review of systems: Review of Systems  Constitutional: Negative for fever and chills.  HENT: Positive for sinus   Eyes: Negative for blurred vision.  Respiratory: Negative for cough, shortness of breath  and wheezing.   Cardiovascular: Negative for chest pain and palpitations.  Gastrointestinal: as per HPI Genitourinary: Negative for dysuria, urgency, frequency and hematuria.  Musculoskeletal: Negative for myalgias, back pain and joint pain.  Skin: Negative for itching and rash.  Neurological: Negative for dizziness, tremors, focal weakness, seizures and loss of consciousness.  Endo/Heme/Allergies: Positive for seasonal allergies.  Psychiatric/Behavioral: Negative for depression, suicidal ideas and hallucinations. Positive for anxiety All other systems reviewed and are  negative.   Physical Exam: Vitals:   09/26/17 0827  BP: 108/62  Pulse: 70   Body mass index is 33.73 kg/m. Gen:      No acute distress HEENT:  EOMI, sclera anicteric Neck:     No masses; no thyromegaly Lungs:    Clear to auscultation bilaterally; normal respiratory effort CV:         Regular rate and rhythm; no murmurs Abd:      + bowel sounds; soft, non-tender; no palpable masses, no distension Ext:    No edema; adequate peripheral perfusion Skin:      Warm and dry; no rash Neuro: alert and oriented x 3 Psych: normal mood and affect  Data Reviewed:  Reviewed labs, radiology imaging, old records and pertinent past GI work up   Assessment and Plan/Recommendations:  46 year old female with family history of colon cancer and multiple second-degree relatives, multiple colon polyps in her mother who recently had segmental resection of her colon for removal of a large polyp. We will schedule for colonoscopy for colorectal cancer screening The risks and benefits as well as alternatives of endoscopic procedure(s) have been discussed and reviewed. All questions answered. The patient agrees to proceed.  Constipation: Advised patient to increase dietary fiber and fluid intake Start Benefiber 1 tablespoon twice daily with meals  Return as needed after the procedure  K. 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 Negative for malignancy. At age 6

## 2017-10-09 ENCOUNTER — Encounter: Payer: Self-pay | Admitting: Gastroenterology

## 2017-10-17 ENCOUNTER — Telehealth: Payer: Self-pay | Admitting: Gastroenterology

## 2017-10-17 NOTE — Telephone Encounter (Signed)
Patient coming in to pick up [place] today

## 2017-10-20 ENCOUNTER — Encounter: Payer: Self-pay | Admitting: Gastroenterology

## 2017-10-20 ENCOUNTER — Other Ambulatory Visit: Payer: Self-pay

## 2017-10-20 ENCOUNTER — Ambulatory Visit (AMBULATORY_SURGERY_CENTER): Payer: BLUE CROSS/BLUE SHIELD | Admitting: Gastroenterology

## 2017-10-20 VITALS — BP 111/85 | HR 59 | Temp 97.8°F | Resp 14 | Ht 66.0 in | Wt 209.0 lb

## 2017-10-20 DIAGNOSIS — Z8371 Family history of colonic polyps: Secondary | ICD-10-CM

## 2017-10-20 DIAGNOSIS — Z1212 Encounter for screening for malignant neoplasm of rectum: Secondary | ICD-10-CM | POA: Diagnosis not present

## 2017-10-20 DIAGNOSIS — Z800 Family history of malignant neoplasm of digestive organs: Secondary | ICD-10-CM

## 2017-10-20 DIAGNOSIS — K635 Polyp of colon: Secondary | ICD-10-CM

## 2017-10-20 DIAGNOSIS — D125 Benign neoplasm of sigmoid colon: Secondary | ICD-10-CM

## 2017-10-20 DIAGNOSIS — Z1211 Encounter for screening for malignant neoplasm of colon: Secondary | ICD-10-CM

## 2017-10-20 MED ORDER — SODIUM CHLORIDE 0.9 % IV SOLN: 500.0000 mL | Freq: Once | INTRAVENOUS | Status: DC

## 2017-10-20 NOTE — Progress Notes (Signed)
Called to room to assist during endoscopic procedure.  Patient ID and intended procedure confirmed with present staff. Received instructions for my participation in the procedure from the performing physician.  

## 2017-10-20 NOTE — Progress Notes (Signed)
Pt's states no medical or surgical changes since previsit or office visit. 

## 2017-10-20 NOTE — Patient Instructions (Signed)
Impression/Recommendations:  Polyp handout given to patient. Hemorrhoid handout given to patient.  Resume previous diet. Continue present medications.  Repeat colonoscopy in 5 years for surveillance based on pathology results.  YOU HAD AN ENDOSCOPIC PROCEDURE TODAY AT [facility]:   Refer to the procedure report that was given to you for any specific questions about what was found during the examination.  If the procedure report does not answer your questions, please call your gastroenterologist to clarify.  If you requested that your care partner not be given the details of your procedure findings, then the procedure report has been included in a sealed envelope for you to review at your convenience later.  YOU SHOULD EXPECT: Some feelings of bloating in the abdomen. Passage of more gas than usual.  Walking can help get rid of the air that was put into your GI tract during the procedure and reduce the bloating. If you had a lower endoscopy (such as a colonoscopy or flexible sigmoidoscopy) you may notice spotting of blood in your stool or on the toilet paper. If you underwent a bowel prep for your procedure, you may not have a normal bowel movement for a few days.  Please Note:  You might notice some irritation and congestion in your nose or some drainage.  This is from the oxygen used during your procedure.  There is no need for concern and it should clear up in a day or so.  SYMPTOMS TO REPORT IMMEDIATELY:   Following lower endoscopy (colonoscopy or flexible sigmoidoscopy):  Excessive amounts of blood in the stool  Significant tenderness or worsening of abdominal pains  Swelling of the abdomen that is new, acute  Fever of 100F or higher For urgent or emergent issues, a gastroenterologist can be reached at any hour by calling [phone].   DIET:  We do recommend a small meal at first, but then you may proceed to your regular diet.  Drink plenty of fluids but you  should avoid alcoholic beverages for 24 hours.  ACTIVITY:  You should plan to take it easy for the rest of today and you should NOT DRIVE or use heavy machinery until tomorrow (because of the sedation medicines used during the test).    FOLLOW UP: Our staff will call the number listed on your records the next business day following your procedure to check on you and address any questions or concerns that you may have regarding the information given to you following your procedure. If we do not reach you, we will leave a message.  However, if you are feeling well and you are not experiencing any problems, there is no need to return our call.  We will assume that you have returned to your regular daily activities without incident.  If any biopsies were taken you will be contacted by phone or by letter within the next 1-3 weeks.  Please call us at [phone] if you have not heard about the biopsies in 3 weeks.    SIGNATURES/CONFIDENTIALITY: You and/or your care partner have signed paperwork which will be entered into your electronic medical record.  These signatures attest to the fact that that the information above on your After Visit Summary has been reviewed and is understood.  Full responsibility of the confidentiality of this discharge information lies with you and/or your care-partner.

## 2017-10-20 NOTE — Op Note (Signed)
[place] Patient Name: Allison Garza Procedure Date: 10/20/2017 9:45 AM MRN: [id number] Endoscopist: Mauri Pole , MD Age: 46 Referring MD:  Date of Birth: 1971-09-01 Gender: Female Account #: [id number] Procedure:                Colonoscopy Indications:              Colon cancer screening in patient with 1st-degree                            relative having sessile serrated colon polyp                            smaller than 10 mm with no dysplasia , Colon cancer                            screening in patient at increased risk: Family                            history of colorectal cancer in multiple 2nd degree                            relatives Medicines:                Monitored Anesthesia Care Procedure:                Pre-Anesthesia Assessment:                           - Prior to the procedure, a History and Physical                            was performed, and patient medications and                            allergies were reviewed. The patient's tolerance of                            previous anesthesia was also reviewed. The risks                            and benefits of the procedure and the sedation                            options and risks were discussed with the patient.                            All questions were answered, and informed consent                            was obtained. Prior Anticoagulants: The patient has                            taken no previous anticoagulant or antiplatelet  agents. ASA Grade Assessment: II - A patient with                            mild systemic disease. After reviewing the risks                            and benefits, the patient was deemed in                            satisfactory condition to undergo the procedure.                           After obtaining informed consent, the colonoscope                            was passed under direct vision. Throughout  the                            procedure, the patient's blood pressure, pulse, and                            oxygen saturations were monitored continuously. The                            Colonoscope was introduced through the anus and                            advanced to the the cecum, identified by                            appendiceal orifice and ileocecal valve. The                            colonoscopy was performed without difficulty. The                            patient tolerated the procedure well. The quality                            of the bowel preparation was excellent. The                            terminal ileum, ileocecal valve, appendiceal                            orifice, and rectum were photographed. Scope In: 9:49:28 AM Scope Out: 10:03:24 AM Scope Withdrawal Time: 0 hours 9 minutes 41 seconds  Total Procedure Duration: 0 hours 13 minutes 56 seconds  Findings:                 The perianal and digital rectal examinations were                            normal.  A 2 mm polyp was found in the sigmoid colon. The                            polyp was sessile. The polyp was removed with a                            cold biopsy forceps. Resection and retrieval were                            complete.                           Non-bleeding internal hemorrhoids were found during                            retroflexion. The hemorrhoids were small.                           The exam was otherwise without abnormality. Complications:            No immediate complications. Estimated Blood Loss:     Estimated blood loss was minimal. Impression:               - One 2 mm polyp in the sigmoid colon, removed with                            a cold biopsy forceps. Resected and retrieved.                           - Non-bleeding internal hemorrhoids.                           - The examination was otherwise normal. Recommendation:           - Patient  has a contact number available for                            emergencies. The signs and symptoms of potential                            delayed complications were discussed with the                            patient. Return to normal activities tomorrow.                            Written discharge instructions were provided to the                            patient.                           - Resume previous diet.                           - Continue present medications.                           -  Await pathology results.                           - Repeat colonoscopy in 5 years for surveillance                            based on pathology results. Mauri Pole, MD 10/20/2017 10:13:02 AM This report has been signed electronically.

## 2017-10-20 NOTE — Progress Notes (Signed)
Report to PACU, RN, vss, BBS= Clear.  

## 2017-10-23 ENCOUNTER — Telehealth: Payer: Self-pay | Admitting: *Deleted

## 2017-10-23 NOTE — Telephone Encounter (Signed)
Second call. No answer, left message to call if questions or concerns. 

## 2017-10-23 NOTE — Telephone Encounter (Signed)
  Follow up Call-  Call back number 10/20/2017  Post procedure Call Back phone  # [phone]  Permission to leave phone message Yes  Some recent data might be hidden     No answer at # given.  LM on VM.

## 2017-10-26 ENCOUNTER — Encounter: Payer: Self-pay | Admitting: Gastroenterology

## 2017-12-05 ENCOUNTER — Ambulatory Visit: Payer: Self-pay | Admitting: Family

## 2017-12-05 ENCOUNTER — Encounter: Payer: Self-pay | Admitting: Family

## 2017-12-05 VITALS — BP 110/80 | HR 87 | Temp 98.5°F | Resp 16 | Wt 215.0 lb

## 2017-12-05 DIAGNOSIS — J209 Acute bronchitis, unspecified: Secondary | ICD-10-CM

## 2017-12-05 DIAGNOSIS — R6889 Other general symptoms and signs: Secondary | ICD-10-CM

## 2017-12-05 MED ORDER — BENZONATATE 200 MG PO CAPS: 200.0000 mg | ORAL_CAPSULE | Freq: Two times a day (BID) | ORAL | 0 refills | Status: DC | PRN

## 2017-12-05 MED ORDER — PREDNISONE 10 MG (21) PO TBPK: ORAL_TABLET | ORAL | 0 refills | Status: DC

## 2017-12-05 NOTE — Patient Instructions (Signed)

## 2017-12-05 NOTE — Progress Notes (Signed)
   Subjective:    Patient ID: Allison Garza, female    DOB: 1971-08-14, 47 y.o.   MRN: [id number]  Pt presents to the office today with cough. States she was getting her nails done and starting coughing and could not stop.  Cough  This is a new problem. The current episode started yesterday. The problem has been waxing and waning. The problem occurs every few minutes. The cough is non-productive. Associated symptoms include chills, headaches, myalgias, nasal congestion and wheezing. Pertinent negatives include no ear congestion, fever or sore throat. She has tried rest for the symptoms. The treatment provided no relief. There is no history of COPD.      Review of Systems  Constitutional: Positive for chills. Negative for fever.  HENT: Negative for sore throat.   Respiratory: Positive for cough and wheezing.   Musculoskeletal: Positive for myalgias.  Neurological: Positive for headaches.  All other systems reviewed and are negative.      Objective:   Physical Exam  Constitutional: She is oriented to person, place, and time. She appears well-developed and well-nourished. No distress.  HENT:  Head: Normocephalic and atraumatic.  Right Ear: External ear normal.  Left Ear: External ear normal.  Nose: Mucosal edema and rhinorrhea present.  Eyes: Pupils are equal, round, and reactive to light.  Neck: Normal range of motion. Neck supple. No thyromegaly present.  Cardiovascular: Normal rate, regular rhythm, normal heart sounds and intact distal pulses.  No murmur heard. Pulmonary/Chest: Effort normal. No respiratory distress. She has wheezes.  Intermittent productive clear cough   Abdominal: Soft. Bowel sounds are normal. She exhibits no distension. There is no tenderness.  Musculoskeletal: Normal range of motion. She exhibits no edema or tenderness.  Neurological: She is alert and oriented to person, place, and time. She has normal reflexes. No cranial nerve deficit.  Skin:  Skin is warm and dry.  Psychiatric: She has a normal mood and affect. Her behavior is normal. Judgment and thought content normal.  Vitals reviewed.     BP 110/80 (BP Location: Right Arm, Patient Position: Sitting, Cuff Size: Normal)   Pulse 87   Temp 98.5 F (36.9 C) (Oral)   Resp 16   Wt 215 lb (97.5 kg)   SpO2 98%   BMI 34.70 kg/m      Assessment & Plan:  1. Flu-like symptoms - Veritor Flu A/B Waived  2. Acute bronchitis, unspecified organism - Take meds as prescribed - Use a cool mist humidifier  -Use saline nose sprays frequently -Saline irrigations of the nose can be very helpful if done frequently.  * 4X daily for 1 week*  * Use of a nettie pot can be helpful with this. Follow directions with this* -Force fluids -For any cough or congestion  Use plain Mucinex- regular strength or max strength is fine   * Children- consult with Pharmacist for dosing -For fever or aces or pains- take tylenol or ibuprofen appropriate for age and weight.  * for fevers greater than 101 orally you may alternate ibuprofen and tylenol every  3 hours. -Throat lozenges if help - predniSONE (STERAPRED UNI-PAK 21 TAB) 10 MG (21) TBPK tablet; Use as directed  Dispense: 21 tablet; Refill: 0 - benzonatate (TESSALON) 200 MG capsule; Take 1 capsule (200 mg total) by mouth 2 (two) times daily as needed for cough.  Dispense: 20 capsule; Refill: 0   Evelina Dun, FNP

## 2017-12-07 ENCOUNTER — Telehealth: Payer: Self-pay

## 2017-12-11 ENCOUNTER — Encounter: Payer: Self-pay | Admitting: Family Medicine

## 2017-12-11 ENCOUNTER — Ambulatory Visit (INDEPENDENT_AMBULATORY_CARE_PROVIDER_SITE_OTHER): Payer: BLUE CROSS/BLUE SHIELD | Admitting: Family Medicine

## 2017-12-11 ENCOUNTER — Other Ambulatory Visit: Payer: Self-pay

## 2017-12-11 VITALS — BP 112/72 | HR 87 | Temp 98.7°F | Resp 16 | Ht 66.0 in | Wt 211.8 lb

## 2017-12-11 DIAGNOSIS — R6889 Other general symptoms and signs: Secondary | ICD-10-CM

## 2017-12-11 DIAGNOSIS — J208 Acute bronchitis due to other specified organisms: Secondary | ICD-10-CM

## 2017-12-11 LAB — POC INFLUENZA A&B (BINAX/QUICKVUE)
Influenza A, POC: NEGATIVE
Influenza B, POC: NEGATIVE

## 2017-12-11 MED ORDER — ALBUTEROL SULFATE HFA 108 (90 BASE) MCG/ACT IN AERS
2.0000 | INHALATION_SPRAY | Freq: Four times a day (QID) | RESPIRATORY_TRACT | 2 refills | Status: DC | PRN
Start: 2017-12-11 — End: 2023-09-26

## 2017-12-11 MED ORDER — AZITHROMYCIN 250 MG PO TABS: ORAL_TABLET | ORAL | 0 refills | Status: DC

## 2017-12-11 NOTE — Patient Instructions (Addendum)
   IF you received an x-ray today, you will receive an invoice from [facility]. Please contact [facility] at [phone] with questions or concerns regarding your invoice.   IF you received labwork today, you will receive an invoice from [facility]. Please contact [facility] at [phone] with questions or concerns regarding your invoice.   Our billing staff will not be able to assist you with questions regarding bills from these companies.  You will be contacted with the lab results as soon as they are available. The fastest way to get your results is to activate your My Chart account. Instructions are located on the last page of this paperwork. If you have not heard from us regarding the results in 2 weeks, please contact this office.     Acute Bronchitis, Adult Acute bronchitis is sudden (acute) swelling of the air tubes (bronchi) in the lungs. Acute bronchitis causes these tubes to fill with mucus, which can make it hard to breathe. It can also cause coughing or wheezing. In adults, acute bronchitis usually goes away within 2 weeks. A cough caused by bronchitis may last up to 3 weeks. Smoking, allergies, and asthma can make the condition worse. Repeated episodes of bronchitis may cause further lung problems, such as chronic obstructive pulmonary disease (COPD). What are the causes? This condition can be caused by germs and by substances that irritate the lungs, including:  Cold and flu viruses. This condition is most often caused by the same virus that causes a cold.  Bacteria.  Exposure to tobacco smoke, dust, fumes, and air pollution.  What increases the risk? This condition is more likely to develop in people who:  Have close contact with someone with acute bronchitis.  Are exposed to lung irritants, such as tobacco smoke, dust, fumes, and vapors.  Have a weak immune system.  Have a respiratory condition such as asthma.  What are the signs or  symptoms? Symptoms of this condition include:  A cough.  Coughing up clear, yellow, or green mucus.  Wheezing.  Chest congestion.  Shortness of breath.  A fever.  Body aches.  Chills.  A sore throat.  How is this diagnosed? This condition is usually diagnosed with a physical exam. During the exam, your health care provider may order tests, such as chest X-rays, to rule out other conditions. He or she may also:  Test a sample of your mucus for bacterial infection.  Check the level of oxygen in your blood. This is done to check for pneumonia.  Do a chest X-ray or lung function testing to rule out pneumonia and other conditions.  Perform blood tests.  Your health care provider will also ask about your symptoms and medical history. How is this treated? Most cases of acute bronchitis clear up over time without treatment. Your health care provider may recommend:  Drinking more fluids. Drinking more makes your mucus thinner, which may make it easier to breathe.  Taking a medicine for a fever or cough.  Taking an antibiotic medicine.  Using an inhaler to help improve shortness of breath and to control a cough.  Using a cool mist vaporizer or humidifier to make it easier to breathe.  Follow these instructions at home: Medicines  Take over-the-counter and prescription medicines only as told by your health care provider.  If you were prescribed an antibiotic, take it as told by your health care provider. Do not stop taking the antibiotic even if you start to feel better. General instructions    Get plenty of rest.  Drink enough fluids to keep your urine clear or pale yellow.  Avoid smoking and secondhand smoke. Exposure to cigarette smoke or irritating chemicals will make bronchitis worse. If you smoke and you need help quitting, ask your health care provider. Quitting smoking will help your lungs heal faster.  Use an inhaler, cool mist vaporizer, or humidifier as told  by your health care provider.  Keep all follow-up visits as told by your health care provider. This is important. How is this prevented? To lower your risk of getting this condition again:  Wash your hands often with soap and water. If soap and water are not available, use hand sanitizer.  Avoid contact with people who have cold symptoms.  Try not to touch your hands to your mouth, nose, or eyes.  Make sure to get the flu shot every year.  Contact a health care provider if:  Your symptoms do not improve in 2 weeks of treatment. Get help right away if:  You cough up blood.  You have chest pain.  You have severe shortness of breath.  You become dehydrated.  You faint or keep feeling like you are going to faint.  You keep vomiting.  You have a severe headache.  Your fever or chills gets worse. This information is not intended to replace advice given to you by your health care provider. Make sure you discuss any questions you have with your health care provider. Document Released: 11/24/2004 Document Revised: 05/11/2016 Document Reviewed: 04/06/2016 Elsevier Interactive Patient Education  2018 [employer]   

## 2017-12-11 NOTE — Progress Notes (Signed)
Chief Complaint  Patient presents with  . flu like symptoms x 1 week    seen at [facility] week, flu negative, c/o cough, rn, sneezing, fever, chills and bodyaches.  Took mucinex last night, put on prednisone 10 mg (finished the dosage) and benzonatate but hasn't taken in a couple days.- these given by [facility]    HPI  Completed prednisone series 12/10/17 She was seen a week ago and had a negative flu but was given prednisone for her cough and bronchitis She states that she has been taking mucinex and benzonatate  She has muscle aches, sore throat and worsening cough despite being on steroids She was on a steroid taper of 21 tablets She now has wheezing, worsening cough and chills She reports fevers    Past Medical History:  Diagnosis Date  . Anxiety   . GERD (gastroesophageal reflux disease)   . Sleep apnea     Current Outpatient Medications  Medication Sig Dispense Refill  . gabapentin (NEURONTIN) 300 MG capsule Take 300 mg by mouth 3 (three) times daily.    Marland Kitchen levonorgestrel (MIRENA, 52 MG,) 20 MCG/24HR IUD 1 Device by Intrauterine route daily.    Marland Kitchen albuterol (PROVENTIL HFA;VENTOLIN HFA) 108 (90 Base) MCG/ACT inhaler Inhale 2 puffs into the lungs every 6 (six) hours as needed for wheezing or shortness of breath. 1 Inhaler 2  . azithromycin (ZITHROMAX) 250 MG tablet Take 2 tablets on day 1, then one tablet each day after 6 tablet 0  . benzonatate (TESSALON) 200 MG capsule Take 1 capsule (200 mg total) by mouth 2 (two) times daily as needed for cough. (Patient not taking: Reported on 12/11/2017) 20 capsule 0   No current facility-administered medications for this visit.     Allergies:  Allergies  Allergen Reactions  . Penicillins   . Shellfish Allergy Hives    Past Surgical History:  Procedure Laterality Date  . KNEE SURGERY    . MASTECTOMY      Social History   Socioeconomic History  . Marital status: Single    Spouse name: None  . Number of  children: None  . Years of education: None  . Highest education level: None  Social Needs  . Financial resource strain: None  . Food insecurity - worry: None  . Food insecurity - inability: None  . Transportation needs - medical: None  . Transportation needs - non-medical: None  Occupational History  . None  Tobacco Use  . Smoking status: Never Smoker  . Smokeless tobacco: Never Used  Substance and Sexual Activity  . Alcohol use: Yes    Comment: occasionally  . Drug use: No  . Sexual activity: None  Other Topics Concern  . None  Social History Narrative  . None    Family History  Problem Relation Age of Onset  . Diabetes Mother   . Hypertension Mother   . Heart failure Father   . Colon cancer Paternal Aunt   . Colon cancer Maternal Uncle      ROS Review of Systems See HPI Skin: No rash or itching Eyes: no blurry vision, no double vision GU: no dysuria or hematuria   all others reviewed and negative   Objective: Vitals:   12/11/17 1155  BP: 112/72  Pulse: 87  Resp: 16  Temp: 98.7 F (37.1 C)  TempSrc: Oral  SpO2: 97%  Weight: 211 lb 12.8 oz (96.1 kg)  Height: 5\' 6"  (1.676 m)    Physical Exam General:  alert, oriented, in NAD Head: normocephalic, atraumatic, no sinus tenderness Eyes: EOM intact, no scleral icterus or conjunctival injection Ears: TM clear bilaterally Nose: mucosa nonerythematous, nonedematous Throat: no pharyngeal exudate or erythema Lymph: no posterior auricular, submental or cervical lymph adenopathy Heart: normal rate, normal sinus rhythm, no murmurs Lungs: scant wheeze in the lower lobe bilaterally, improves with coughing  Rapid flu neg   Assessment and Plan Allison Garza was seen today for flu like symptoms x 1 week.  Diagnoses and all orders for this visit:  Flu-like symptoms -     POC Influenza A&B(BINAX/QUICKVUE) -     albuterol (PROVENTIL HFA;VENTOLIN HFA) 108 (90 Base) MCG/ACT inhaler; Inhale 2 puffs into the lungs  every 6 (six) hours as needed for wheezing or shortness of breath. -     azithromycin (ZITHROMAX) 250 MG tablet; Take 2 tablets on day 1, then one tablet each day after  Acute bronchitis due to other specified organisms -     albuterol (PROVENTIL HFA;VENTOLIN HFA) 108 (90 Base) MCG/ACT inhaler; Inhale 2 puffs into the lungs every 6 (six) hours as needed for wheezing or shortness of breath. -     azithromycin (ZITHROMAX) 250 MG tablet; Take 2 tablets on day 1, then one tablet each day after  Advised rest, hydration and supportive care Flu negative Wheezing so recommended zpak and albuterol Return to clinic prn   [place]

## 2018-05-18 ENCOUNTER — Ambulatory Visit (HOSPITAL_BASED_OUTPATIENT_CLINIC_OR_DEPARTMENT_OTHER)
Admit: 2018-05-18 | Discharge: 2018-05-18 | Disposition: A | Discharge status: Home / Self Care | Payer: BLUE CROSS/BLUE SHIELD

## 2018-05-18 ENCOUNTER — Encounter (HOSPITAL_COMMUNITY): Payer: Self-pay

## 2018-05-18 ENCOUNTER — Ambulatory Visit (HOSPITAL_COMMUNITY)
Admission: EM | Admit: 2018-05-18 | Discharge: 2018-05-18 | Disposition: A | Discharge status: Home / Self Care | Payer: BLUE CROSS/BLUE SHIELD | Attending: Internal Medicine | Admitting: Internal Medicine

## 2018-05-18 ENCOUNTER — Encounter (HOSPITAL_COMMUNITY): Payer: BLUE CROSS/BLUE SHIELD

## 2018-05-18 DIAGNOSIS — M79672 Pain in left foot: Secondary | ICD-10-CM | POA: Diagnosis not present

## 2018-05-18 DIAGNOSIS — M79662 Pain in left lower leg: Secondary | ICD-10-CM | POA: Diagnosis present

## 2018-05-18 DIAGNOSIS — Z8249 Family history of ischemic heart disease and other diseases of the circulatory system: Secondary | ICD-10-CM | POA: Diagnosis not present

## 2018-05-18 DIAGNOSIS — Z833 Family history of diabetes mellitus: Secondary | ICD-10-CM | POA: Diagnosis not present

## 2018-05-18 DIAGNOSIS — Z91013 Allergy to seafood: Secondary | ICD-10-CM | POA: Insufficient documentation

## 2018-05-18 DIAGNOSIS — Z880 Allergy status to penicillin: Secondary | ICD-10-CM | POA: Insufficient documentation

## 2018-05-18 DIAGNOSIS — Z79899 Other long term (current) drug therapy: Secondary | ICD-10-CM | POA: Insufficient documentation

## 2018-05-18 DIAGNOSIS — M79609 Pain in unspecified limb: Secondary | ICD-10-CM

## 2018-05-18 DIAGNOSIS — R600 Localized edema: Secondary | ICD-10-CM | POA: Insufficient documentation

## 2018-05-18 LAB — POCT I-STAT, CHEM 8
BUN: 10 mg/dL (ref 6–20)
CALCIUM ION: 1.18 mmol/L (ref 1.15–1.40)
CHLORIDE: 100 mmol/L (ref 98–111)
Creatinine, Ser: 0.9 mg/dL (ref 0.44–1.00)
Glucose, Bld: 103 mg/dL — ABNORMAL HIGH (ref 70–99)
HCT: 41 % (ref 36.0–46.0)
Hemoglobin: 13.9 g/dL (ref 12.0–15.0)
Potassium: 4.1 mmol/L (ref 3.5–5.1)
SODIUM: 139 mmol/L (ref 135–145)
TCO2: 29 mmol/L (ref 22–32)

## 2018-05-18 MED ORDER — FUROSEMIDE 20 MG PO TABS: 20.0000 mg | ORAL_TABLET | Freq: Every day | ORAL | 0 refills | Status: DC

## 2018-05-18 MED ORDER — HYDROCHLOROTHIAZIDE 25 MG PO TABS: 12.5000 mg | ORAL_TABLET | Freq: Every day | ORAL | 1 refills | Status: DC

## 2018-05-18 MED ORDER — MELOXICAM 7.5 MG PO TABS: 7.5000 mg | ORAL_TABLET | Freq: Every day | ORAL | 1 refills | Status: DC

## 2018-05-18 NOTE — ED Provider Notes (Signed)
[place]    CSN: [id number] Arrival date & time: 05/18/18  1228     History   Chief Complaint Chief Complaint  Patient presents with  . Foot Pain    Appointment [id number]  . Leg Swelling    HPI Allison Garza is a 47 y.o. female.   Patient is a 47 year old female that presents with 2 days of left lower leg pain and swelling.  The swelling is mostly in her left foot.  She denies any injury to the leg or foot.  The pain radiates from foot to left thigh.  She also has pain in the left arm.  The pain radiates from left hand into left shoulder.  Describes it as a throbbing and aching.  No injury to the arm.  She does not have pain in the calf area.  Denies any numbness or tingling.   The patient has chronic bilateral lower extremity edema.  This time the edema is worse with more pain.  There is no erythema, ecchymosis, rashes.  There is no warmth to the area.  She denies any fever, chills, fatigue.  She denies any chest pain, shortness of breath, dizziness.   She does a lot of traveling in the car long distance.  She is not a smoker.  She has a family history of DVT.  ROS per HPI      Past Medical History:  Diagnosis Date  . Anxiety   . GERD (gastroesophageal reflux disease)   . Sleep apnea     Patient Active Problem List   Diagnosis Date Noted  . Anxiety 03/29/2013  . Depression 03/29/2013  . Globus sensation 03/29/2013  . Obstructive sleep apnea 03/29/2013    Past Surgical History:  Procedure Laterality Date  . KNEE SURGERY    . MASTECTOMY      OB History   None      Home Medications    Prior to Admission medications   Medication Sig Start Date End Date Taking? Authorizing Provider  levonorgestrel (MIRENA, 52 MG,) 20 MCG/24HR IUD 1 Device by Intrauterine route daily.   Yes [provider]  albuterol (PROVENTIL HFA;VENTOLIN HFA) 108 (90 Base) MCG/ACT inhaler Inhale 2 puffs into the lungs every 6 (six) hours as needed for  wheezing or shortness of breath. 12/11/17   Forrest Moron, MD  azithromycin (ZITHROMAX) 250 MG tablet Take 2 tablets on day 1, then one tablet each day after 12/11/17   Forrest Moron, MD  benzonatate (TESSALON) 200 MG capsule Take 1 capsule (200 mg total) by mouth 2 (two) times daily as needed for cough. Patient not taking: Reported on 12/11/2017 12/05/17   Evelina Dun A, FNP  furosemide (LASIX) 20 MG tablet Take 1 tablet (20 mg total) by mouth daily. 05/18/18   Loura Halt A, NP  gabapentin (NEURONTIN) 300 MG capsule Take 300 mg by mouth 3 (three) times daily.    [provider]  meloxicam (MOBIC) 7.5 MG tablet Take 1 tablet (7.5 mg total) by mouth daily. 05/18/18   Orvan July, NP    Family History Family History  Problem Relation Age of Onset  . Diabetes Mother   . Hypertension Mother   . Heart failure Father   . Colon cancer Paternal Aunt   . Colon cancer Maternal Uncle     Social History Social History   Tobacco Use  . Smoking status: Never Smoker  . Smokeless tobacco: Never Used  Substance Use Topics  . Alcohol  use: Yes    Comment: occasionally  . Drug use: No     Allergies   Penicillins and Shellfish allergy   Review of Systems Review of Systems   Physical Exam Triage Vital Signs ED Triage Vitals [05/18/18 1302]  Enc Vitals Group     BP 137/87     Pulse Rate 79     Resp 18     Temp 98.7 F (37.1 C)     Temp src      SpO2 98 %     Weight      Height      Head Circumference      Peak Flow      Pain Score 4     Pain Loc      Pain Edu?      Excl. in [place]?    No data found.  Updated Vital Signs BP 137/87   Pulse 79   Temp 98.7 F (37.1 C)   Resp 18   SpO2 98%   Visual Acuity Right Eye Distance:   Left Eye Distance:   Bilateral Distance:    Right Eye Near:   Left Eye Near:    Bilateral Near:     Physical Exam  Constitutional: She is oriented to person, place, and time. She appears well-developed and well-nourished.  HENT:    Head: Normocephalic and atraumatic.  Neck: Normal range of motion.  Cardiovascular: Normal rate, regular rhythm and normal heart sounds.  Pulmonary/Chest: Effort normal and breath sounds normal.  Musculoskeletal: She exhibits edema and tenderness. She exhibits no deformity.  3+ pitting edema to left foot extending into ankle.  No erythema, warmth, ecchymosis, deformity, rashes or ulcers.  Tender to palpation of left foot, left shin and left upper thigh.  Tender to palpation of left upper extremity.  No swelling, erythema, ecchymosis, warmth in left upper extremity.  Sensation intact.  Distal pulses intact.  Full range of motion of foot and lower extremity  2+ pitting edema in right foot extending into ankle.  No erythema, warmth, ecchymosis, deformity, rashes or ulcers.  Nontender to palpation of right foot.  Sensation intact.  Distal pulses intact.  Full range of motion of foot and lower extremity  Neurological: She is alert and oriented to person, place, and time.  Skin: Skin is warm and dry. Capillary refill takes less than 2 seconds.  Psychiatric: She has a normal mood and affect.  Nursing note and vitals reviewed.    UC Treatments / Results  Labs (all labs ordered are listed, but only abnormal results are displayed) Labs Reviewed  POCT I-STAT, CHEM 8 - Abnormal; Notable for the following components:      Result Value   Glucose, Bld 103 (*)    All other components within normal limits    EKG None  Radiology No results found.  Procedures Procedures (including critical care time)  Medications Ordered in UC Medications - No data to display  Initial Impression / Assessment and Plan / UC Course  I have reviewed the triage vital signs and the nursing notes.  Pertinent labs & imaging results that were available during my care of the patient were reviewed by me and considered in my medical decision making (see chart for details).     Due to extremity swelling and pain we  will do DVT study to rule out blood clot.  Other differentials include RA/OA, lupus, or some other inflammatory process..  This is something that she may want to discuss  with her primary care provider for further testing.  She had mentioned being tested for similar medical conditions in the past with inconclusive results.  If this is negative we will start meloxicam for inflammation and pain.  Trying 20 to 40 mg of Lasix for the next 4 to 5 days to see if this helps with the edema in the lower extremities.  I-STAT Chem-8 was normal with good kidney function.  Patient agreeable to plan.  Return precautions given.  Vascular call with verbal report of negative DVT study.  Spoke with patient and agrees to continue with plan of meloxicam and Lasix.  Final Clinical Impressions(s) / UC Diagnoses   Final diagnoses:  Bilateral lower extremity edema  Foot pain, left     Discharge Instructions     It was nice meeting you!!  Please go straight over for your ultrasound.  I am giving you some medication for inflammation and a small dose of diuretic for the swelling.  We will call you with the results of your ultrasound. Try to elevate your feet as much as possible.  Take breaks on long trips to get the circulation going in your legs.  Follow up with your PCP if no improvement.     ED Prescriptions    Medication Sig Dispense Auth. Provider   meloxicam (MOBIC) 7.5 MG tablet Take 1 tablet (7.5 mg total) by mouth daily. 30 tablet Nickolai Rinks A, NP   hydrochlorothiazide (HYDRODIURIL) 25 MG tablet  (Status: Discontinued) Take 0.5 tablets (12.5 mg total) by mouth daily. 30 tablet Matin Mattioli A, NP   furosemide (LASIX) 20 MG tablet Take 1 tablet (20 mg total) by mouth daily. 10 tablet Loura Halt A, NP     Controlled Substance Prescriptions  Controlled Substance Registry consulted? Not Applicable   Orvan July, NP 05/18/18 1535

## 2018-05-18 NOTE — Discharge Instructions (Addendum)
It was nice meeting you!!  Please go straight over for your ultrasound.  I am giving you some medication for inflammation and a small dose of diuretic for the swelling.  We will call you with the results of your ultrasound. Try to elevate your feet as much as possible.  Take breaks on long trips to get the circulation going in your legs.  Follow up with your PCP if no improvement.

## 2018-05-18 NOTE — ED Triage Notes (Signed)
Pt presents with onset yesterday of left foot pain that is radiating to her left leg and arm.  Pt also has worsening swelling in both feet and ankles, the left more than the right. Has chronic swelling in her ankles but this is not normal for her.

## 2018-05-18 NOTE — Progress Notes (Signed)
*  Preliminary Results* Left lower extremity venous duplex completed. Left lower extremity is negative for deep vein thrombosis. There is no evidence of left Baker's cyst.  05/18/2018 3:01 PM  Maudry Mayhew, BS, RVT, RDCS, RDMS

## 2019-09-11 ENCOUNTER — Ambulatory Visit (HOSPITAL_COMMUNITY)
Admission: EM | Admit: 2019-09-11 | Discharge: 2019-09-11 | Disposition: A | Discharge status: Home / Self Care | Payer: BLUE CROSS/BLUE SHIELD | Attending: Family Medicine | Admitting: Family Medicine

## 2019-09-11 ENCOUNTER — Other Ambulatory Visit: Payer: Self-pay

## 2019-09-11 ENCOUNTER — Ambulatory Visit: Payer: BLUE CROSS/BLUE SHIELD | Admitting: Family Medicine

## 2019-09-11 ENCOUNTER — Encounter (HOSPITAL_COMMUNITY): Payer: Self-pay

## 2019-09-11 DIAGNOSIS — M79652 Pain in left thigh: Secondary | ICD-10-CM

## 2019-09-11 DIAGNOSIS — S39012A Strain of muscle, fascia and tendon of lower back, initial encounter: Secondary | ICD-10-CM

## 2019-09-11 DIAGNOSIS — S161XXA Strain of muscle, fascia and tendon at neck level, initial encounter: Secondary | ICD-10-CM

## 2019-09-11 MED ORDER — CYCLOBENZAPRINE HCL 10 MG PO TABS: ORAL_TABLET | ORAL | 0 refills | Status: DC

## 2019-09-11 MED ORDER — DICLOFENAC SODIUM 75 MG PO TBEC: 75.0000 mg | DELAYED_RELEASE_TABLET | Freq: Two times a day (BID) | ORAL | 0 refills | Status: DC
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
## 2019-12-10 NOTE — Telephone Encounter (Signed)
Error

## 2019-12-11 DIAGNOSIS — R059 Cough, unspecified: Secondary | ICD-10-CM | POA: Insufficient documentation

## 2019-12-11 DIAGNOSIS — J310 Chronic rhinitis: Secondary | ICD-10-CM | POA: Insufficient documentation

## 2020-04-09 ENCOUNTER — Emergency Department (HOSPITAL_COMMUNITY)
Admission: EM | Admit: 2020-04-09 | Discharge: 2020-04-09 | Disposition: A | Discharge status: Home / Self Care | Payer: No Typology Code available for payment source | Attending: Emergency Medicine | Admitting: Emergency Medicine

## 2020-04-09 ENCOUNTER — Encounter (HOSPITAL_COMMUNITY): Payer: Self-pay

## 2020-04-09 ENCOUNTER — Other Ambulatory Visit: Payer: Self-pay

## 2020-04-09 ENCOUNTER — Emergency Department (HOSPITAL_COMMUNITY): Payer: No Typology Code available for payment source

## 2020-04-09 ENCOUNTER — Ambulatory Visit (INDEPENDENT_AMBULATORY_CARE_PROVIDER_SITE_OTHER)
Admission: EM | Admit: 2020-04-09 | Discharge: 2020-04-09 | Disposition: A | Discharge status: Home / Self Care | Payer: No Typology Code available for payment source | Source: Home / Self Care

## 2020-04-09 DIAGNOSIS — R079 Chest pain, unspecified: Secondary | ICD-10-CM | POA: Diagnosis present

## 2020-04-09 DIAGNOSIS — M79602 Pain in left arm: Secondary | ICD-10-CM

## 2020-04-09 DIAGNOSIS — R0789 Other chest pain: Secondary | ICD-10-CM

## 2020-04-09 DIAGNOSIS — R110 Nausea: Secondary | ICD-10-CM

## 2020-04-09 LAB — BASIC METABOLIC PANEL
Anion gap: 8 (ref 5–15)
BUN: 11 mg/dL (ref 6–20)
CO2: 27 mmol/L (ref 22–32)
Calcium: 9.3 mg/dL (ref 8.9–10.3)
Chloride: 104 mmol/L (ref 98–111)
Creatinine, Ser: 0.77 mg/dL (ref 0.44–1.00)
GFR calc Af Amer: >60 mL/min (ref >60)
GFR calc non Af Amer: >60 mL/min (ref >60)
Glucose, Bld: 96 mg/dL (ref 70–99)
Potassium: 4 mmol/L (ref 3.5–5.1)
Sodium: 139 mmol/L (ref 135–145)

## 2020-04-09 LAB — TROPONIN I (HIGH SENSITIVITY)
Troponin I (High Sensitivity): <2 ng/L (ref <18)
Troponin I (High Sensitivity): <2 ng/L (ref <18)

## 2020-04-09 LAB — HEPATIC FUNCTION PANEL
ALT: 16 U/L (ref 0–44)
AST: 19 U/L (ref 15–41)
Albumin: 4 g/dL (ref 3.5–5.0)
Alkaline Phosphatase: 53 U/L (ref 38–126)
Bilirubin, Direct: <0.1 mg/dL (ref 0.0–0.2)
Total Bilirubin: 0.9 mg/dL (ref 0.3–1.2)
Total Protein: 7.4 g/dL (ref 6.5–8.1)

## 2020-04-09 LAB — CBC
HCT: 42.4 % (ref 36.0–46.0)
Hemoglobin: 13.8 g/dL (ref 12.0–15.0)
MCH: 30.5 pg (ref 26.0–34.0)
MCHC: 32.5 g/dL (ref 30.0–36.0)
MCV: 93.6 fL (ref 80.0–100.0)
Platelets: 283 10*3/uL (ref 150–400)
RBC: 4.53 MIL/uL (ref 3.87–5.11)
RDW: 11.5 % (ref 11.5–15.5)
WBC: 3.8 10*3/uL — ABNORMAL LOW (ref 4.0–10.5)
nRBC: 0 % (ref 0.0–0.2)

## 2020-04-09 MED ORDER — ACETAMINOPHEN 325 MG PO TABS
650.0000 mg | ORAL_TABLET | Freq: Once | ORAL | Status: AC
Start: 2020-04-09 — End: 2020-04-09
  Administered 2020-04-09: 650 mg via ORAL
  Filled 2020-04-09: qty 2

## 2020-04-09 MED ORDER — SODIUM CHLORIDE 0.9% FLUSH: 3.0000 mL | Freq: Once | INTRAVENOUS | Status: DC

## 2020-04-09 NOTE — Discharge Instructions (Addendum)
Follow-up with either your family doctor or cardiology in the next couple weeks for recheck.  Take Tylenol for pain

## 2020-04-09 NOTE — ED Provider Notes (Signed)
[name]    CSN: [id number] Arrival date & time: 04/09/20  0915      History   Chief Complaint Chief Complaint  Patient presents with  . Arm Pain  . Nausea    HPI Allison Garza is a 49 y.o. female.   Patient presents to urgent care for evaluation of 2-day history of left arm pain as well as new onset nausea with chest pressure this morning.  She reports 2 days ago she was seated and started to notice left upper arm aching pain.  This came out of nowhere.  She also describes some tingling down her arm that has developed since then as well.  She reports certain movements do elicit this pain.  Denies radiation into the shoulder and neck.  She points to her tricep area where the pain is.  Denies exercise in the day proceeding.  Denies injury.  She works in her garden however this was not intense.  She is adamant this started while at rest without proceeding episode.  She reports she woke this morning with some lower chest pressure and nausea.  She thought this may have been related to not eating dinner last night however nausea and chest pressure persisted despite eating some oatmeal.  She denies any vomiting.  Denies sweating.  Denies shortness of breath.  She reports she is concerned about whether this could be a heart attack.  She does report a history of having tingling in her hands and was on gabapentin before.  Denies unilateral tingling such as she is experiencing today.  She reports she takes no medications regularly with the exception of medicines for fibromyalgia.  She reports she was previously on Metformin for PCOS and insulin resistance however she has no around this.  She does report her father had" heart issues" in his 26s.     Past Medical History:  Diagnosis Date  . Anxiety   . GERD (gastroesophageal reflux disease)   . Sleep apnea     Patient Active Problem List   Diagnosis Date Noted  . Anxiety 03/29/2013  . Depression 03/29/2013  .  Globus sensation 03/29/2013  . Obstructive sleep apnea 03/29/2013    Past Surgical History:  Procedure Laterality Date  . KNEE SURGERY    . MASTECTOMY      OB History   No obstetric history on file.      Home Medications    Prior to Admission medications   Medication Sig Start Date End Date Taking? Authorizing Provider  doxycycline (VIBRAMYCIN) 100 MG capsule Take 100 mg by mouth 2 (two) times daily.   Yes [provider]  Peak Flow Meter (PERSONAL BEST FULL RANGE) DEVI by Does not apply route. 12/11/19  Yes [provider]  albuterol (PROVENTIL HFA;VENTOLIN HFA) 108 (90 Base) MCG/ACT inhaler Inhale 2 puffs into the lungs every 6 (six) hours as needed for wheezing or shortness of breath. 12/11/17   Forrest Moron, MD  azithromycin (ZITHROMAX) 250 MG tablet Take 2 tablets on day 1, then one tablet each day after 12/11/17   Forrest Moron, MD  benzonatate (TESSALON) 200 MG capsule Take 1 capsule (200 mg total) by mouth 2 (two) times daily as needed for cough. Patient not taking: Reported on 12/11/2017 12/05/17   Evelina Dun A, FNP  cyclobenzaprine (FLEXERIL) 10 MG tablet Take 1 tablet by mouth 3 times daily as needed for muscle spasm. Warning: May cause drowsiness. 09/11/19   Vanessa Kick, MD  diclofenac (VOLTAREN)  75 MG EC tablet Take 1 tablet (75 mg total) by mouth 2 (two) times daily. 09/11/19   Vanessa Kick, MD  furosemide (LASIX) 20 MG tablet Take 1 tablet (20 mg total) by mouth daily. 05/18/18   Loura Halt A, NP  gabapentin (NEURONTIN) 300 MG capsule Take 300 mg by mouth 3 (three) times daily.    [provider]  levonorgestrel (MIRENA, 52 MG,) 20 MCG/24HR IUD 1 Device by Intrauterine route daily.    [provider]  meloxicam (MOBIC) 7.5 MG tablet Take 1 tablet (7.5 mg total) by mouth daily. 05/18/18   Orvan July, NP    Family History Family History  Problem Relation Age of Onset  . Diabetes Mother   . Hypertension Mother   . Heart  failure Father   . Colon cancer Paternal Aunt   . Colon cancer Maternal Uncle     Social History Social History   Tobacco Use  . Smoking status: Never Smoker  . Smokeless tobacco: Never Used  Vaping Use  . Vaping Use: Never used  Substance Use Topics  . Alcohol use: Yes    Comment: occasionally  . Drug use: No     Allergies   Penicillins and Shellfish allergy   Review of Systems Review of Systems   Physical Exam Triage Vital Signs ED Triage Vitals  Enc Vitals Group     BP 04/09/20 0952 138/74     Pulse Rate 04/09/20 0952 74     Resp 04/09/20 0952 18     Temp 04/09/20 0952 98.4 F (36.9 C)     Temp Source 04/09/20 0952 Oral     SpO2 04/09/20 0952 99 %     Weight 04/09/20 0949 230 lb (104.3 kg)     Height --      Head Circumference --      Peak Flow --      Pain Score 04/09/20 0948 3     Pain Loc --      Pain Edu? --      Excl. in [place]? --    No data found.  Updated Vital Signs BP 138/74 (BP Location: Right Arm)   Pulse 74   Temp 98.4 F (36.9 C) (Oral)   Resp 18   Wt 230 lb (104.3 kg)   SpO2 99%   BMI 37.12 kg/m   Visual Acuity Right Eye Distance:   Left Eye Distance:   Bilateral Distance:    Right Eye Near:   Left Eye Near:    Bilateral Near:     Physical Exam Vitals and nursing note reviewed.  Constitutional:      General: She is not in acute distress.    Appearance: She is well-developed. She is not ill-appearing or diaphoretic.  HENT:     Head: Normocephalic and atraumatic.  Eyes:     Conjunctiva/sclera: Conjunctivae normal.  Cardiovascular:     Rate and Rhythm: Normal rate and regular rhythm.     Heart sounds: No murmur heard.   Pulmonary:     Effort: Pulmonary effort is normal. No respiratory distress.     Breath sounds: Normal breath sounds. No wheezing or rales.     Comments: There is some sternal tenderness that is not described as the same pain she is experiencing. Abdominal:     Palpations: Abdomen is soft.      Tenderness: There is no abdominal tenderness.  Musculoskeletal:     Cervical back: Neck supple.     Right  lower leg: No edema.     Left lower leg: No edema.     Comments: There is some tenderness in the left upper arm.  Pain is somewhat reproducible with movement of the left arm and resisted range of motion.  Strength is 5/5.  Sensation grossly intact.  Skin:    General: Skin is warm and dry.     Findings: No rash.  Neurological:     General: No focal deficit present.     Mental Status: She is alert and oriented to person, place, and time.      UC Treatments / Results  Labs (all labs ordered are listed, but only abnormal results are displayed) Labs Reviewed - No data to display  EKG Normal sinus rhythm.  No ST elevation.  T wave inversion in V1.  No comparison.  Otherwise normal EKG.  Radiology No results found.  Procedures Procedures (including critical care time)  Medications Ordered in UC Medications - No data to display  Initial Impression / Assessment and Plan / UC Course  I have reviewed the triage vital signs and the nursing notes.  Pertinent labs & imaging results that were available during my care of the patient were reviewed by me and considered in my medical decision making (see chart for details).     #Chest pressure #Nausea without vomiting #Left arm pain Patient is a 49 year old otherwise healthy female presenting with chest pressure, nausea and left arm pain.  EKG normal.  Discussed with patient low risk for cardiac however she is anxious about whether this could be a myocardial infarction.  I discussed that EKG is only part of this work-up and that if she wants to be fully reassured she should report to the emergency department for troponins.  Patient elects to report for troponin draw emergency department for cardiac rule out.  Patient discharged with instructions to report to [facility] emergency department for cardiac rule out. Final Clinical  Impressions(s) / UC Diagnoses   Final diagnoses:  Chest pressure  Nausea without vomiting  Left arm pain     Discharge Instructions     Please report to the ER for further evaluation   ED Prescriptions    None     PDMP not reviewed this encounter.   Purnell Shoemaker, PA-C 04/09/20 1158

## 2020-04-09 NOTE — ED Notes (Signed)
Got patient on the monitor into a gown patient is resting with call bell in reach  

## 2020-04-09 NOTE — ED Triage Notes (Signed)
Pt is here with 2 days of left arm pain and nausea that started this morning, pt states she still feels nausea even after eating breakfast this morning.

## 2020-04-09 NOTE — ED Triage Notes (Signed)
Patient complains of SSCP with tingling to left arm with nausea and headache since awakening. Had the arm tingling earlier in week.

## 2020-04-09 NOTE — Discharge Instructions (Signed)
Please report to the ER for further evaluation

## 2020-04-09 NOTE — ED Provider Notes (Signed)
[facility] EMERGENCY DEPARTMENT Provider Note   CSN: [id number] Arrival date & time: 04/09/20  1126     History No chief complaint on file.   Allison Garza is a 49 y.o. female.  Patient states she has been having some left-sided chest discomfort.  She is not having any pain now.  Also has a headache  The history is provided by the patient. No language interpreter was used.  Chest Pain Pain location:  L chest Pain quality: aching   Pain radiates to:  Does not radiate Pain severity:  Mild Onset quality:  Sudden Timing:  Intermittent Progression:  Waxing and waning Chronicity:  New Context: not breathing   Associated symptoms: no abdominal pain, no back pain, no cough, no fatigue and no headache        Past Medical History:  Diagnosis Date  . Anxiety   . GERD (gastroesophageal reflux disease)   . Sleep apnea     Patient Active Problem List   Diagnosis Date Noted  . Anxiety 03/29/2013  . Depression 03/29/2013  . Globus sensation 03/29/2013  . Obstructive sleep apnea 03/29/2013    Past Surgical History:  Procedure Laterality Date  . KNEE SURGERY    . MASTECTOMY       OB History   No obstetric history on file.     Family History  Problem Relation Age of Onset  . Diabetes Mother   . Hypertension Mother   . Heart failure Father   . Colon cancer Paternal Aunt   . Colon cancer Maternal Uncle     Social History   Tobacco Use  . Smoking status: Never Smoker  . Smokeless tobacco: Never Used  Vaping Use  . Vaping Use: Never used  Substance Use Topics  . Alcohol use: Yes    Comment: occasionally  . Drug use: No    Home Medications Prior to Admission medications   Medication Sig Start Date End Date Taking? Authorizing Provider  albuterol (PROVENTIL HFA;VENTOLIN HFA) 108 (90 Base) MCG/ACT inhaler Inhale 2 puffs into the lungs every 6 (six) hours as needed for wheezing or shortness of breath. 12/11/17  Yes Stallings, Zoe A,  MD  diclofenac Sodium (VOLTAREN) 1 % GEL APPLY A SMALL AMOUNT TO RIGHT KNEE UP TO 3 TIMES PER DAY AS NEEDED. 12/26/19  Yes [provider]  doxycycline (VIBRA-TABS) 100 MG tablet Take 100 mg by mouth daily. 04/08/20  Yes [provider]  levonorgestrel (MIRENA, 52 MG,) 20 MCG/24HR IUD 1 Device by Intrauterine route daily.   Yes [provider]  minoxidil (ROGAINE) 2 % external solution Apply 1 application topically daily.   Yes [provider]  Peak Flow Meter (PERSONAL BEST FULL RANGE) DEVI by Does not apply route. 12/11/19  Yes [provider]  tacrolimus (PROTOPIC) 0.1 % ointment Apply 1 application topically as needed (scalp irritation).  03/06/20  Yes [provider]  benzonatate (TESSALON) 200 MG capsule Take 1 capsule (200 mg total) by mouth 2 (two) times daily as needed for cough. Patient not taking: Reported on 12/11/2017 12/05/17   Evelina Dun A, FNP  cyclobenzaprine (FLEXERIL) 10 MG tablet Take 1 tablet by mouth 3 times daily as needed for muscle spasm. Warning: May cause drowsiness. Patient not taking: Reported on 04/09/2020 09/11/19   Vanessa Kick, MD  diclofenac (VOLTAREN) 75 MG EC tablet Take 1 tablet (75 mg total) by mouth 2 (two) times daily. Patient not taking: Reported on 04/09/2020 09/11/19   Hagler,  Aaron Edelman, MD  furosemide (LASIX) 20 MG tablet Take 1 tablet (20 mg total) by mouth daily. Patient not taking: Reported on 04/09/2020 05/18/18   Loura Halt A, NP  meloxicam (MOBIC) 7.5 MG tablet Take 1 tablet (7.5 mg total) by mouth daily. Patient not taking: Reported on 04/09/2020 05/18/18   Loura Halt A, NP    Allergies    Penicillins and Shellfish allergy  Review of Systems   Review of Systems  Constitutional: Negative for appetite change and fatigue.  HENT: Negative for congestion, ear discharge and sinus pressure.   Eyes: Negative for discharge.  Respiratory: Negative for cough.   Cardiovascular: Positive for chest pain.   Gastrointestinal: Negative for abdominal pain and diarrhea.  Genitourinary: Negative for frequency and hematuria.  Musculoskeletal: Negative for back pain.  Skin: Negative for rash.  Neurological: Negative for seizures and headaches.  Psychiatric/Behavioral: Negative for hallucinations.    Physical Exam Updated Vital Signs BP 133/78   Pulse 75   Temp 98.3 F (36.8 C) (Oral)   Resp 16   Ht 5\' 6"  (1.676 m)   Wt 104.3 kg   SpO2 100%   BMI 37.12 kg/m   Physical Exam Vitals and nursing note reviewed.  Constitutional:      Appearance: She is well-developed.  HENT:     Head: Normocephalic.     Mouth/Throat:     Mouth: Mucous membranes are moist.  Eyes:     General: No scleral icterus.    Conjunctiva/sclera: Conjunctivae normal.  Neck:     Thyroid: No thyromegaly.  Cardiovascular:     Rate and Rhythm: Normal rate and regular rhythm.     Heart sounds: No murmur heard.  No friction rub. No gallop.   Pulmonary:     Breath sounds: No stridor. No wheezing or rales.  Chest:     Chest wall: No tenderness.  Abdominal:     General: There is no distension.     Tenderness: There is no abdominal tenderness. There is no rebound.  Musculoskeletal:        General: Normal range of motion.     Cervical back: Neck supple.  Lymphadenopathy:     Cervical: No cervical adenopathy.  Skin:    Findings: No erythema or rash.  Neurological:     Mental Status: She is alert and oriented to person, place, and time.     Motor: No abnormal muscle tone.     Coordination: Coordination normal.  Psychiatric:        Behavior: Behavior normal.     ED Results / Procedures / Treatments   Labs (all labs ordered are listed, but only abnormal results are displayed) Labs Reviewed  CBC - Abnormal; Notable for the following components:      Result Value   WBC 3.8 (*)    All other components within normal limits  BASIC METABOLIC PANEL  HEPATIC FUNCTION PANEL  TROPONIN I (HIGH SENSITIVITY)   TROPONIN I (HIGH SENSITIVITY)    EKG None  Radiology DG Chest 2 View  Result Date: 04/09/2020 CLINICAL DATA:  Left arm pain, chest pain EXAM: CHEST - 2 VIEW COMPARISON:  07/26/2011 FINDINGS: The heart size and mediastinal contours are within normal limits. Both lungs are clear. The visualized skeletal structures are unremarkable. IMPRESSION: No active cardiopulmonary disease. Electronically Signed   By: Rolm Baptise M.D.   On: 04/09/2020 12:32    Procedures Procedures (including critical care time)  Medications Ordered in ED Medications  sodium chloride flush (NS)  0.9 % injection 3 mL (3 mLs Intravenous Not Given 04/09/20 1244)  acetaminophen (TYLENOL) tablet 650 mg (650 mg Oral Given 04/09/20 1247)    ED Course  I have reviewed the triage vital signs and the nursing notes.  Pertinent labs & imaging results that were available during my care of the patient were reviewed by me and considered in my medical decision making (see chart for details).    MDM Rules/Calculators/A&P                          Patient with atypical chest pain and normal EKG had normal troponin she will follow up with her PCP or cardiology       This patient presents to the ED for concern of chest pain this involves an extensive number of treatment options, and is a complaint that carries with it a high risk of complications and morbidity.  The differential diagnosis includes MI pneumonia   Lab Tests:   I Ordered, reviewed, and interpreted labs, which included CBC chemistries troponin all unremarkable  Medicines ordered:   I ordered medication Tylenol for headache  Imaging Studies ordered:   I ordered imaging studies which included chest x-ray and  I independently visualized and interpreted imaging which showed unremarkable  Additional history obtained:   Additional history obtained from urgent care visit  Previous records obtained and reviewed  Consultations  Obtained:  Reevaluation:  After the interventions stated above, I reevaluated the patient and found improved  Critical Interventions:  .   Final Clinical Impression(s) / ED Diagnoses Final diagnoses:  Atypical chest pain    Rx / DC Orders ED Discharge Orders    None       Milton Ferguson, MD 04/09/20 [phone]

## 2020-04-09 NOTE — ED Notes (Signed)
Patient is being discharged from the [facility] and sent to the Emergency Department via wheelchair by staff. Per Roland Rack, PA, patient is stable but in need of higher level of care due to arm pain, nausea. Patient is aware and verbalizes understanding of plan of care.  Vitals:   04/09/20 0952  BP: 138/74  Pulse: 74  Resp: 18  Temp: 98.4 F (36.9 C)  SpO2: 99%

## 2020-04-19 NOTE — Progress Notes (Signed)
Cardiology Office Note:   Date:  04/21/2020  NAME:  Allison Garza    MRN: [id number] DOB:  1971-01-06   PCP:  Nicoletta Dress, MD  Cardiologist:  No primary care provider on file.  Electrophysiologist:  None   Referring MD: Nicoletta Dress, MD   Chief Complaint  Patient presents with  . Chest Pain   History of Present Illness:   Allison Garza is a 49 y.o. female with a hx of GERD, anxiety who is being seen today for the evaluation of chest pain at the request of Nicoletta Dress, MD. Evaluated in the ER 04/09/2020 for atypical CP. EKG normal. Troponin negative.  She reports she has had constant left arm pain 04/09/2020.  She was evaluated in the emergency room with normal work-up.  She reports that she was awoken from her sleep with headache nausea and arm pain.  Apparently the headache and nausea have resolved her pain is constant.  She reports it is not better with movement.  There is no identifiable trigger.  No recent trauma.  She describes soreness in the arm.  She also reports she had lower extremity edema for years.  She was informed she had poor veins and has never had any cardiac work-up.  Medical history is significant for undifferentiated arthritis.  She possibly has fibromyalgia per the report of rheumatologist.  She reports she is concerned she possibly has heart disease.  She does have heart disease in the family.  She is never smoker.  She consumes alcohol moderation.  No illicit drug use.  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  She teaches online courses in women studies.  She is a native of [place].  The lower extremity edema is bothersome to her.  She reports has had this for years.  Swelling is worse the end of the day.  She does not exercise routinely but has no chest pain or arm pain when she exerts himself.  Her EKG today in office is normal.  Her cardiovascular exam is benign.  Past Medical History: Past Medical History:  Diagnosis Date  .  Anxiety   . GERD (gastroesophageal reflux disease)   . Sleep apnea     Past Surgical History: Past Surgical History:  Procedure Laterality Date  . KNEE SURGERY    . MASTECTOMY      Current Medications: Current Meds  Medication Sig  . albuterol (PROVENTIL HFA;VENTOLIN HFA) 108 (90 Base) MCG/ACT inhaler Inhale 2 puffs into the lungs every 6 (six) hours as needed for wheezing or shortness of breath.  . doxycycline (VIBRA-TABS) 100 MG tablet Take 100 mg by mouth daily.  Marland Kitchen gabapentin (NEURONTIN) 300 MG capsule Take 300 mg by mouth daily.  Marland Kitchen levonorgestrel (MIRENA, 52 MG,) 20 MCG/24HR IUD 1 Device by Intrauterine route daily.  . meloxicam (MOBIC) 7.5 MG tablet Take 1 tablet (7.5 mg total) by mouth daily.  . minoxidil (ROGAINE) 2 % external solution Apply 1 application topically daily.  . Peak Flow Meter (PERSONAL BEST FULL RANGE) DEVI by Does not apply route.  . tacrolimus (PROTOPIC) 0.1 % ointment Apply 1 application topically as needed (scalp irritation).   . [DISCONTINUED] benzonatate (TESSALON) 200 MG capsule Take 1 capsule (200 mg total) by mouth 2 (two) times daily as needed for cough.  . [DISCONTINUED] cyclobenzaprine (FLEXERIL) 10 MG tablet Take 1 tablet by mouth 3 times daily as needed for muscle spasm. Warning: May cause drowsiness.  . [DISCONTINUED] diclofenac (VOLTAREN) 75 MG EC  tablet Take 1 tablet (75 mg total) by mouth 2 (two) times daily.  . [DISCONTINUED] diclofenac Sodium (VOLTAREN) 1 % GEL APPLY A SMALL AMOUNT TO RIGHT KNEE UP TO 3 TIMES PER DAY AS NEEDED.  . [DISCONTINUED] furosemide (LASIX) 20 MG tablet Take 1 tablet (20 mg total) by mouth daily.     Allergies:    Penicillins and Shellfish allergy   Social History: Social History   Socioeconomic History  . Marital status: Single    Spouse name: Not on file  . Number of children: Not on file  . Years of education: Not on file  . Highest education level: Not on file  Occupational History  . Occupation:  Professor - Literature  Tobacco Use  . Smoking status: Never Smoker  . Smokeless tobacco: Never Used  Vaping Use  . Vaping Use: Never used  Substance and Sexual Activity  . Alcohol use: Yes    Comment: occasionally  . Drug use: No  . Sexual activity: Yes    Birth control/protection: I.U.D.  Other Topics Concern  . Not on file  Social History Narrative  . Not on file   Social Determinants of Health   Financial Resource Strain:   . Difficulty of Paying Living Expenses:   Food Insecurity:   . Worried About Charity fundraiser in the Last Year:   . Arboriculturist in the Last Year:   Transportation Needs:   . Film/video editor (Medical):   Marland Kitchen Lack of Transportation (Non-Medical):   Physical Activity:   . Days of Exercise per Week:   . Minutes of Exercise per Session:   Stress:   . Feeling of Stress :   Social Connections:   . Frequency of Communication with Friends and Family:   . Frequency of Social Gatherings with Friends and Family:   . Attends Religious Services:   . Active Member of Clubs or Organizations:   . Attends Archivist Meetings:   Marland Kitchen Marital Status:      Family History: The patient's family history includes Colon cancer in her maternal uncle and paternal aunt; Diabetes in her mother; Heart attack in her maternal grandfather; Heart disease in her father; Heart failure in her father; Hypertension in her mother.  ROS:   All other ROS reviewed and negative. Pertinent positives noted in the HPI.     EKGs/Labs/Other Studies Reviewed:   The following studies were personally reviewed by me today:  EKG:  EKG is ordered today.  The ekg ordered today demonstrates normal sinus rhythm, heart rate 64, no acute ST-T changes, no evidence of prior infarction, and was personally reviewed by me.   Recent Labs: 04/09/2020: ALT 16; BUN 11; Creatinine, Ser 0.77; Hemoglobin 13.8; Platelets 283; Potassium 4.0; Sodium 139   Recent Lipid Panel No results found  for: CHOL, TRIG, HDL, CHOLHDL, VLDL, LDLCALC, LDLDIRECT  Physical Exam:   VS:  BP (!) 126/96 (BP Location: Right Arm, Patient Position: Sitting, Cuff Size: Large)   Pulse 64   Temp (!) 97.1 F (36.2 C)   Ht '5\' 6"'  (1.676 m)   Wt 232 lb (105.2 kg)   SpO2 96%   BMI 37.45 kg/m    Wt Readings from Last 3 Encounters:  04/21/20 232 lb (105.2 kg)  04/09/20 230 lb (104.3 kg)  04/09/20 230 lb (104.3 kg)    General: Well nourished, well developed, in no acute distress Heart: Atraumatic, normal size  Eyes: PEERLA, EOMI  Neck: Supple, no  JVD Endocrine: No thryomegaly Cardiac: Normal S1, S2; RRR; no murmurs, rubs, or gallops Lungs: Clear to auscultation bilaterally, no wheezing, rhonchi or rales  Abd: Soft, nontender, no hepatomegaly  Ext: No edema, pulses 2+ Musculoskeletal: No deformities, BUE and BLE strength normal and equal Skin: Warm and dry, no rashes   Neuro: Alert and oriented to person, place, time, and situation, CNII-XII grossly intact, no focal deficits  Psych: Normal mood and affect   ASSESSMENT:   Allison Garza is a 49 y.o. female who presents for the following: 1. Chest pain, unspecified type   2. Leg edema     PLAN:   1. Chest pain, unspecified type -Arm pain.  Left-sided.  Very atypical for cardiac pain.  CVD risk factors include obesity and family history.  She reports her cholesterol panel was checked by her primary care physician was normal.  I do not have the results of this.  We will proceed with coronary CTA to exclude obstructive CAD.  She is interested in making sure her heart is okay.  She will take 50 mg metoprolol tartrate try before the scan.  We will also proceed with an echocardiogram and BNP today.  This will further evaluate her lower extremity edema.  I suspect she has venous insufficiency.  2. Leg edema -BNP, echocardiogram.  Highly suspect this is venous insufficiency.  We will make sure there is no cardiac etiology here.   Disposition:  Return if symptoms worsen or fail to improve.  Medication Adjustments/Labs and Tests Ordered: Current medicines are reviewed at length with the patient today.  Concerns regarding medicines are outlined above.  Orders Placed This Encounter  Procedures  . CT CORONARY MORPH W/CTA COR W/SCORE W/CA W/CM &/OR WO/CM  . CT CORONARY FRACTIONAL FLOW RESERVE DATA PREP  . CT CORONARY FRACTIONAL FLOW RESERVE FLUID ANALYSIS  . Brain natriuretic peptide  . EKG 12-Lead  . ECHOCARDIOGRAM COMPLETE   Meds ordered this encounter  Medications  . metoprolol tartrate (LOPRESSOR) 50 MG tablet    Sig: Take 1 tablet by mouth once for procedure.    Dispense:  1 tablet    Refill:  0    Patient Instructions  Medication Instructions:  Take Metoprolol 50 mg once 2 hours before CT when scheduled.   *If you need a refill on your cardiac medications before your next appointment, please call your pharmacy*   Lab Work: BNP today  If you have labs (blood work) drawn today and your tests are completely normal, you will receive your results only by: Marland Kitchen MyChart Message (if you have MyChart) OR . A paper copy in the mail If you have any lab test that is abnormal or we need to change your treatment, we will call you to review the results.   Testing/Procedures: Your physician has requested that you have cardiac CT. Cardiac computed tomography (CT) is a painless test that uses an x-ray machine to take clear, detailed pictures of your heart. For further information please visit [email or web address]. Please follow instruction sheet as given.   Echocardiogram - Your physician has requested that you have an echocardiogram. Echocardiography is a painless test that uses sound waves to create images of your heart. It provides your doctor with information about the size and shape of your heart and how well your heart's chambers and valves are working. This procedure takes approximately one hour. There are no restrictions for  this procedure. This will be performed at our [address].    Follow-Up: At [facility], you and your health needs are our priority.  As part of our continuing mission to provide you with exceptional heart care, we have created designated Provider Care Teams.  These Care Teams include your primary Cardiologist (physician) and Advanced Practice Providers (APPs -  Physician Assistants and Nurse Practitioners) who all work together to provide you with the care you need, when you need it.  We recommend signing up for the patient portal called "MyChart".  Sign up information is provided on this After Visit Summary.  MyChart is used to connect with patients for Virtual Visits (Telemedicine).  Patients are able to view lab/test results, encounter notes, upcoming appointments, etc.  Non-urgent messages can be sent to your provider as well.   To learn more about what you can do with MyChart, go to [email or web address].    Your next appointment:   As needed  The format for your next appointment:   In Person  Provider:   Eleonore Chiquito, MD   Other Instructions Your cardiac CT will be scheduled at one of the below locations:   [facility] [address] [place], [place] [address] [phone]  If scheduled at [facility], please arrive at the [facility] main entrance of [facility] 30 minutes prior to test start time. Proceed to the [facility] Radiology Department (first floor) to check-in and test prep.   Please follow these instructions carefully (unless otherwise directed):   On the Night Before the Test: . Be sure to Drink plenty of water. . Do not consume any caffeinated/decaffeinated beverages or chocolate 12 hours prior to your test. . Do not take any antihistamines 12 hours prior to your test.  On the Day of the Test: . Drink plenty of water. Do not drink any water within one hour of the test. . Do not eat  any food 4 hours prior to the test. . You may take your regular medications prior to the test.  . Take metoprolol (Lopressor) two hours prior to test. . HOLD Furosemide/Hydrochlorothiazide morning of the test. . FEMALES- please wear underwire-free bra if available  After the Test: . Drink plenty of water. . After receiving IV contrast, you may experience a mild flushed feeling. This is normal. . On occasion, you may experience a mild rash up to 24 hours after the test. This is not dangerous. If this occurs, you can take Benadryl 25 mg and increase your fluid intake. . If you experience trouble breathing, this can be serious. If it is severe call 911 IMMEDIATELY. If it is mild, please call our office. . If you take any of these medications: Glipizide/Metformin, Avandament, Glucavance, please do not take 48 hours after completing test unless otherwise instructed.   Once we have confirmed authorization from your insurance company, we will call you to set up a date and time for your test.   For non-scheduling related questions, please contact the cardiac imaging nurse navigator should you have any questions/concerns: Marchia Bond, Cardiac Imaging Nurse Navigator Burley Saver, Interim Cardiac Imaging Nurse [place] [facility] Direct Office Dial: [phone]   For scheduling needs, including cancellations and rescheduling, please call [phone].       Signed, Addison Naegeli. Audie Box, [place]  [address] [place], [place] [address] [phone]  04/21/2020 9:37 AM

## 2020-04-21 ENCOUNTER — Other Ambulatory Visit: Payer: Self-pay

## 2020-04-21 ENCOUNTER — Ambulatory Visit (INDEPENDENT_AMBULATORY_CARE_PROVIDER_SITE_OTHER): Payer: PRIVATE HEALTH INSURANCE | Admitting: Cardiovascular Disease

## 2020-04-21 ENCOUNTER — Encounter: Payer: Self-pay | Admitting: Cardiovascular Disease

## 2020-04-21 VITALS — BP 126/96 | HR 64 | Temp 97.1°F | Ht 66.0 in | Wt 232.0 lb

## 2020-04-21 DIAGNOSIS — R600 Localized edema: Secondary | ICD-10-CM

## 2020-04-21 DIAGNOSIS — R079 Chest pain, unspecified: Secondary | ICD-10-CM

## 2020-04-21 MED ORDER — METOPROLOL TARTRATE 50 MG PO TABS: ORAL_TABLET | ORAL | 0 refills | Status: DC

## 2020-04-21 NOTE — Patient Instructions (Signed)
Medication Instructions:  Take Metoprolol 50 mg once 2 hours before CT when scheduled.   *If you need a refill on your cardiac medications before your next appointment, please call your pharmacy*   Lab Work: BNP today  If you have labs (blood work) drawn today and your tests are completely normal, you will receive your results only by: Marland Kitchen MyChart Message (if you have MyChart) OR . A paper copy in the mail If you have any lab test that is abnormal or we need to change your treatment, we will call you to review the results.   Testing/Procedures: Your physician has requested that you have cardiac CT. Cardiac computed tomography (CT) is a painless test that uses an x-ray machine to take clear, detailed pictures of your heart. For further information please visit [email or web address]. Please follow instruction sheet as given.   Echocardiogram - Your physician has requested that you have an echocardiogram. Echocardiography is a painless test that uses sound waves to create images of your heart. It provides your doctor with information about the size and shape of your heart and how well your heart's chambers and valves are working. This procedure takes approximately one hour. There are no restrictions for this procedure. This will be performed at our [facility] location - [address], [address].    Follow-Up: At [facility], you and your health needs are our priority.  As part of our continuing mission to provide you with exceptional heart care, we have created designated Provider Care Teams.  These Care Teams include your primary Cardiologist (physician) and Advanced Practice Providers (APPs -  Physician Assistants and Nurse Practitioners) who all work together to provide you with the care you need, when you need it.  We recommend signing up for the patient portal called "MyChart".  Sign up information is provided on this After Visit Summary.  MyChart is used to connect with patients for  Virtual Visits (Telemedicine).  Patients are able to view lab/test results, encounter notes, upcoming appointments, etc.  Non-urgent messages can be sent to your provider as well.   To learn more about what you can do with MyChart, go to [email or web address].    Your next appointment:   As needed  The format for your next appointment:   In Person  Provider:   Eleonore Chiquito, MD   Other Instructions Your cardiac CT will be scheduled at one of the below locations:   [facility] [address] [place], [place] [address] [phone]  If scheduled at [facility], please arrive at the [facility] main entrance of [facility] 30 minutes prior to test start time. Proceed to the [facility] Radiology Department (first floor) to check-in and test prep.   Please follow these instructions carefully (unless otherwise directed):   On the Night Before the Test: . Be sure to Drink plenty of water. . Do not consume any caffeinated/decaffeinated beverages or chocolate 12 hours prior to your test. . Do not take any antihistamines 12 hours prior to your test.  On the Day of the Test: . Drink plenty of water. Do not drink any water within one hour of the test. . Do not eat any food 4 hours prior to the test. . You may take your regular medications prior to the test.  . Take metoprolol (Lopressor) two hours prior to test. . HOLD Furosemide/Hydrochlorothiazide morning of the test. . FEMALES- please wear underwire-free bra if available  After the Test: .  Drink plenty of water. . After receiving IV contrast, you may experience a mild flushed feeling. This is normal. . On occasion, you may experience a mild rash up to 24 hours after the test. This is not dangerous. If this occurs, you can take Benadryl 25 mg and increase your fluid intake. . If you experience trouble breathing, this can be serious. If it is severe call 911 IMMEDIATELY. If it is mild, please call our  office. . If you take any of these medications: Glipizide/Metformin, Avandament, Glucavance, please do not take 48 hours after completing test unless otherwise instructed.   Once we have confirmed authorization from your insurance company, we will call you to set up a date and time for your test.   For non-scheduling related questions, please contact the cardiac imaging nurse navigator should you have any questions/concerns: Marchia Bond, Cardiac Imaging Nurse Navigator Burley Saver, Interim Cardiac Imaging Nurse [place] [facility] Direct Office Dial: [phone]   For scheduling needs, including cancellations and rescheduling, please call [phone].

## 2020-04-22 LAB — BRAIN NATRIURETIC PEPTIDE: BNP: 15.7 pg/mL (ref 0.0–100.0)

## 2020-05-13 ENCOUNTER — Other Ambulatory Visit: Payer: Self-pay

## 2020-05-13 ENCOUNTER — Ambulatory Visit (HOSPITAL_COMMUNITY): Payer: No Typology Code available for payment source | Attending: Cardiology

## 2020-05-13 DIAGNOSIS — J4530 Mild persistent asthma, uncomplicated: Secondary | ICD-10-CM | POA: Insufficient documentation

## 2020-05-13 DIAGNOSIS — R600 Localized edema: Secondary | ICD-10-CM | POA: Insufficient documentation

## 2020-05-15 ENCOUNTER — Telehealth (HOSPITAL_COMMUNITY): Payer: Self-pay | Admitting: *Deleted

## 2020-05-15 NOTE — Telephone Encounter (Signed)

## 2020-05-18 ENCOUNTER — Ambulatory Visit (HOSPITAL_COMMUNITY)
Admission: RE | Admit: 2020-05-18 | Discharge: 2020-05-18 | Disposition: A | Discharge status: Home / Self Care | Payer: No Typology Code available for payment source | Source: Ambulatory Visit | Attending: Cardiovascular Disease | Admitting: Cardiovascular Disease

## 2020-05-18 ENCOUNTER — Encounter: Payer: PRIVATE HEALTH INSURANCE | Admitting: *Deleted

## 2020-05-18 ENCOUNTER — Other Ambulatory Visit: Payer: Self-pay

## 2020-05-18 DIAGNOSIS — R079 Chest pain, unspecified: Secondary | ICD-10-CM | POA: Diagnosis present

## 2020-05-18 DIAGNOSIS — Z006 Encounter for examination for normal comparison and control in clinical research program: Secondary | ICD-10-CM

## 2020-05-18 MED ORDER — NITROGLYCERIN 0.4 MG SL SUBL
SUBLINGUAL_TABLET | SUBLINGUAL | Status: AC
  Administered 2020-05-18: 0.8 mg
  Filled 2020-05-18: qty 2

## 2020-05-18 MED ORDER — IOHEXOL 350 MG/ML SOLN
80.0000 mL | Freq: Once | INTRAVENOUS | Status: AC | PRN
  Administered 2020-05-18: 80 mL via INTRAVENOUS

## 2020-05-18 NOTE — Research (Signed)
Subject Name: Allison Garza  Subject met inclusion and exclusion criteria.  The informed consent form, study requirements and expectations were reviewed with the subject and questions and concerns were addressed prior to the signing of the consent form.  The subject verbalized understanding of the trial requirements.  The subject agreed to participate in the CADFEM Group 4 trial and signed the informed consent at 0716 on 05/18/20  The informed consent was obtained prior to performance of any protocol-specific procedures for the subject.  A copy of the signed informed consent was given to the subject and a copy was placed in the subject's medical record.   Timoteo Gaul

## 2020-09-10 ENCOUNTER — Encounter: Payer: Self-pay | Admitting: Sports Medicine

## 2020-09-10 ENCOUNTER — Ambulatory Visit (INDEPENDENT_AMBULATORY_CARE_PROVIDER_SITE_OTHER): Payer: BC Managed Care – PPO | Admitting: Sports Medicine

## 2020-09-10 ENCOUNTER — Other Ambulatory Visit: Payer: Self-pay

## 2020-09-10 ENCOUNTER — Ambulatory Visit (INDEPENDENT_AMBULATORY_CARE_PROVIDER_SITE_OTHER): Payer: BC Managed Care – PPO

## 2020-09-10 DIAGNOSIS — M2142 Flat foot [pes planus] (acquired), left foot: Secondary | ICD-10-CM

## 2020-09-10 DIAGNOSIS — M79672 Pain in left foot: Secondary | ICD-10-CM

## 2020-09-10 DIAGNOSIS — M79671 Pain in right foot: Secondary | ICD-10-CM | POA: Diagnosis not present

## 2020-09-10 DIAGNOSIS — M2141 Flat foot [pes planus] (acquired), right foot: Secondary | ICD-10-CM

## 2020-09-10 DIAGNOSIS — M21619 Bunion of unspecified foot: Secondary | ICD-10-CM

## 2020-09-10 DIAGNOSIS — I739 Peripheral vascular disease, unspecified: Secondary | ICD-10-CM

## 2020-09-10 NOTE — Progress Notes (Signed)
Subjective: Allison Garza is a 49 y.o. female patient who presents to office for evaluation of swelling to both ankles and feet.  Patient reports now is very difficult for her to wear normal shoes due to swelling states that she has had this issue since she was age 19 but slowly has gotten worse she has seen a cardiologist and nephrologist her PCP has also made some recommendations and in the past she was thought to have lupus and was seen by rheumatologist with no complete resolution of swelling.  Patient reports that she has been told that she needs to wear compression garments but does not due to aesthetics.  Denies injury/trip/fall/sprain/any other causative factors.   Review of system noncontributory  Patient Active Problem List   Diagnosis Date Noted  . Mild persistent asthma 05/13/2020  . Cough 12/11/2019  . Rhinitis 12/11/2019  . Anxiety 03/29/2013  . Depression 03/29/2013  . Globus sensation 03/29/2013  . Obstructive sleep apnea 03/29/2013    Current Outpatient Medications on File Prior to Visit  Medication Sig Dispense Refill  . albuterol (PROVENTIL HFA;VENTOLIN HFA) 108 (90 Base) MCG/ACT inhaler Inhale 2 puffs into the lungs every 6 (six) hours as needed for wheezing or shortness of breath. 1 Inhaler 2  . cyclobenzaprine (FLEXERIL) 10 MG tablet Take by mouth.    . diclofenac Sodium (VOLTAREN) 1 % GEL Apply topically.    Marland Kitchen doxycycline (VIBRA-TABS) 100 MG tablet Take 100 mg by mouth daily.    Marland Kitchen FLOVENT HFA 44 MCG/ACT inhaler Inhale into the lungs.    . gabapentin (NEURONTIN) 300 MG capsule Take 300 mg by mouth daily.    . hydrocortisone 2.5 % cream Apply topically.    Marland Kitchen levonorgestrel (MIRENA, 52 MG,) 20 MCG/24HR IUD 1 Device by Intrauterine route daily.    . meloxicam (MOBIC) 15 MG tablet Take 15 mg by mouth daily.    . meloxicam (MOBIC) 7.5 MG tablet Take 1 tablet (7.5 mg total) by mouth daily. 30 tablet 1  . methylPREDNISolone (MEDROL DOSEPAK) 4 MG TBPK tablet Take  by mouth.    . metoprolol tartrate (LOPRESSOR) 50 MG tablet Take 1 tablet by mouth once for procedure. 1 tablet 0  . minoxidil (ROGAINE) 2 % external solution Apply 1 application topically daily.    . Peak Flow Meter (PERSONAL BEST FULL RANGE) DEVI by Does not apply route.    . tacrolimus (PROTOPIC) 0.1 % ointment Apply 1 application topically as needed (scalp irritation).     . tretinoin (RETIN-A) 0.05 % cream SMARTSIG:Sparingly Topical Every Night     No current facility-administered medications on file prior to visit.    Allergies  Allergen Reactions  . Penicillins   . Shellfish Allergy Hives    Objective:  General: Alert and oriented x3 in no acute distress  Dermatology: No open lesions bilateral lower extremities, no webspace macerations, no ecchymosis bilateral, all nails x 10 are well manicured.  Vascular: Dorsalis Pedis and Posterior Tibial pedal pulses palpable, Capillary Fill Time 3 seconds,(+) pedal hair growth bilateral, trace edema bilateral lower extremities, Temperature gradient within normal limits.  Neurology: Johney Maine sensation intact via light touch bilateral  Musculoskeletal: No reproducible tenderness to palpation bilateral.  There is midtarsal breech supportive of pes planus deformity.  Patient does have significant bunion deformity bilateral that is asymptomatic at this time strength within normal limits in all groups bilateral.   Gait: Antalgic gait  Xrays  Right and left foot   Impression: Increased intermetatarsal angle supportive of  bunion deformity, midtarsal breech supportive of pes planus deformity.  No other acute osseous findings noted.  Assessment and Plan: Problem List Items Addressed This Visit    None    Visit Diagnoses    Pain in left foot    -  Primary   Relevant Orders   DG Foot Complete Left   Pain in right foot       Relevant Orders   DG Foot Complete Right   PVD (peripheral vascular disease) ([place])       Relevant Orders   VAS [place]  LOWER EXTREMITY VENOUS REFLUX   Pes planus of both feet       Bunion           -Complete examination performed -Xrays reviewed -Discussed treatement options for chronic swelling PVD versus body habitus -Rx venous reflux studies to evaluate for extent of PVD -Advised patient to try over-the-counter compression garments to assist with edema control -Advised elevation avoid excessive sodium intake and encourage patient to also further discuss with PCP benefit of diuretic if needed -Patient to return to office after reflux studies or sooner if condition worsens.  Landis Martins, DPM

## 2020-09-15 ENCOUNTER — Other Ambulatory Visit: Payer: Self-pay | Admitting: Sports Medicine

## 2020-09-15 ENCOUNTER — Ambulatory Visit: Payer: PRIVATE HEALTH INSURANCE | Attending: Internal Medicine

## 2020-09-15 DIAGNOSIS — M2142 Flat foot [pes planus] (acquired), left foot: Secondary | ICD-10-CM

## 2020-09-15 DIAGNOSIS — Z23 Encounter for immunization: Secondary | ICD-10-CM

## 2020-09-15 DIAGNOSIS — M2141 Flat foot [pes planus] (acquired), right foot: Secondary | ICD-10-CM

## 2020-09-15 NOTE — Progress Notes (Signed)
   Covid-19 Vaccination Clinic  Name:  Allison Garza    MRN: [id number] DOB: 1971/04/29  09/15/2020  Allison Garza was observed post Covid-19 immunization for 15 minutes without incident. She was provided with Vaccine Information Sheet and instruction to access the V-Safe system.   Allison Garza was instructed to call 911 with any severe reactions post vaccine: Marland Kitchen Difficulty breathing  . Swelling of face and throat  . A fast heartbeat  . A bad rash all over body  . Dizziness and weakness   Immunizations Administered    No immunizations on file.

## 2020-09-17 ENCOUNTER — Telehealth: Payer: Self-pay | Admitting: Sports Medicine

## 2020-09-17 NOTE — Telephone Encounter (Signed)
[facility] ([facility])

## 2020-09-17 NOTE — Telephone Encounter (Signed)
Pt called and wanted to know where she was referred for an ultrasound for her feet.

## 2020-10-06 ENCOUNTER — Other Ambulatory Visit: Payer: Self-pay

## 2020-10-06 ENCOUNTER — Other Ambulatory Visit: Payer: Self-pay | Admitting: Sports Medicine

## 2020-10-06 ENCOUNTER — Telehealth: Payer: Self-pay | Admitting: Sports Medicine

## 2020-10-06 DIAGNOSIS — I739 Peripheral vascular disease, unspecified: Secondary | ICD-10-CM

## 2020-10-06 NOTE — Addendum Note (Signed)
Addended by: Sindy Messing A on: 10/06/2020 02:51 PM   Modules accepted: Orders

## 2020-10-06 NOTE — Progress Notes (Signed)
[facility] to [facility] on [address]. Purpose of the imaging is to check the veins to see if there issue with them that could be adding to her swelling. If she wants to further discuss she can be set up with a virtual appointment tomorrow at 8am and I can discuss with her over the phone Thanks Dr. Chauncey Cruel

## 2020-10-06 NOTE — Addendum Note (Signed)
Addended by: Landis Martins T on: 10/06/2020 02:54 PM   Modules accepted: Orders

## 2020-10-06 NOTE — Telephone Encounter (Signed)
Patient called in stating she's having hard time getting appointment scheduled for [facility], would like to go somewhere closer to home [facility]), also requested call back to discuss more details on vein study,  please advise

## 2020-10-06 NOTE — Telephone Encounter (Signed)
[facility] to [facility] on [address]. Purpose of the imaging is to check the veins to see if there issue with them that could be adding to her swelling. If she wants to further discuss she can be set up with a virtual appointment tomorrow at 8am and I can discuss with her over the phone Thanks Dr. Chauncey Cruel

## 2020-10-07 ENCOUNTER — Other Ambulatory Visit: Payer: Self-pay

## 2020-10-07 ENCOUNTER — Ambulatory Visit (HOSPITAL_COMMUNITY)
Admission: RE | Admit: 2020-10-07 | Discharge: 2020-10-07 | Disposition: A | Discharge status: Home / Self Care | Payer: BC Managed Care – PPO | Source: Ambulatory Visit | Attending: Sports Medicine | Admitting: Sports Medicine

## 2020-10-07 DIAGNOSIS — I739 Peripheral vascular disease, unspecified: Secondary | ICD-10-CM | POA: Diagnosis not present

## 2020-10-08 ENCOUNTER — Encounter: Payer: Self-pay | Admitting: Sports Medicine

## 2020-10-09 ENCOUNTER — Other Ambulatory Visit: Payer: Self-pay | Admitting: Sports Medicine

## 2020-10-09 DIAGNOSIS — I739 Peripheral vascular disease, unspecified: Secondary | ICD-10-CM

## 2020-10-09 NOTE — Progress Notes (Signed)
Referral made to vein and vascular specialist on patient behalf for scheduling to further discuss treatment options for positive venous reflux studies

## 2021-02-28 IMAGING — CT CT HEART MORP W/ CTA COR W/ SCORE W/ CA W/CM &/OR W/O CM
1 of 2 series · 8 of 20 positions shown, 10 images · non-contrast
Comparison: None.
COMPARISON: None.

Addendum:
EXAM:
OVER-READ INTERPRETATION  CT CHEST

The following report is an over-read performed by radiologist Dr.
Abenaba Baiden [REDACTED] on 05/18/2020. This
over-read does not include interpretation of cardiac or coronary
anatomy or pathology. The coronary calcium score/coronary CTA
interpretation by the cardiologist is attached.
CLINICAL DATA: Chest pain
Cardiac/Coronary CTA
TECHNIQUE: The patient was scanned on a Phillips Force scanner. A 100 kV
prospective scan was triggered in the descending thoracic aorta at
111 HU's. Axial non-contrast 3 mm slices were carried out through
the heart. The data set was analyzed on a dedicated work station and
scored using the Agatson method. Gantry rotation speed was 250 msecs
and collimation was .6 mm. No beta blockade and 0.8 mg of sl NTG was
given. The 3D data set was reconstructed in 5% intervals of the
35-75 % of the R-R cycle. Diastolic phases were analyzed on a
dedicated work station using MPR, MIP and VRT modes. The patient
received 80 cc of contrast.

[Series 208: findings · 8 of 16 slices shown, 10 images]
[im 2/16  vessel]
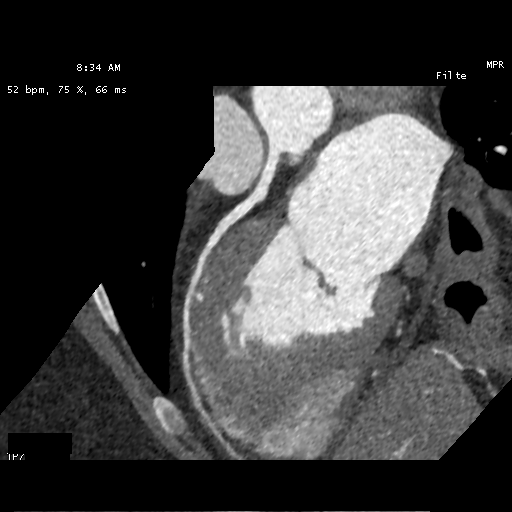
[im 2/16  lung]
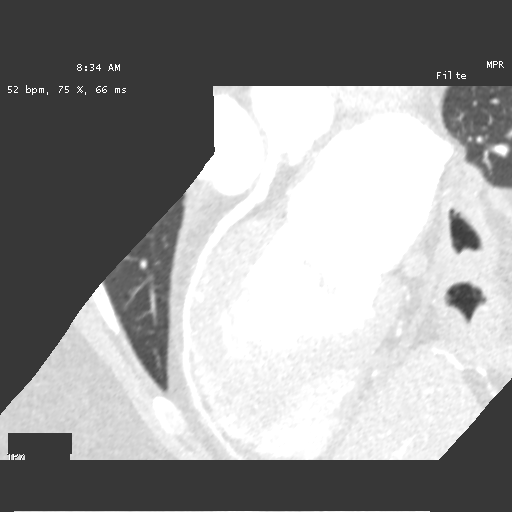
[im 4/16  vessel]
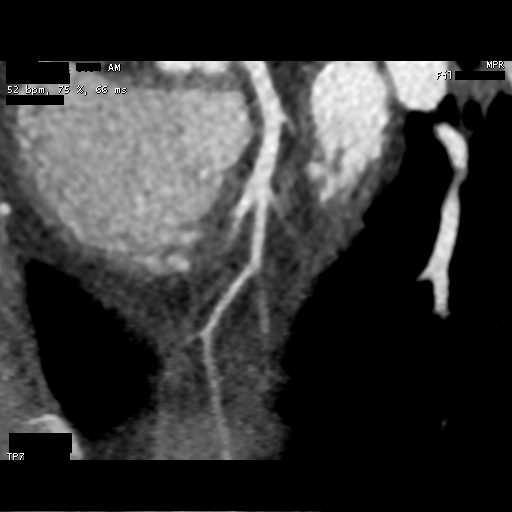
[im 6/16  vessel]
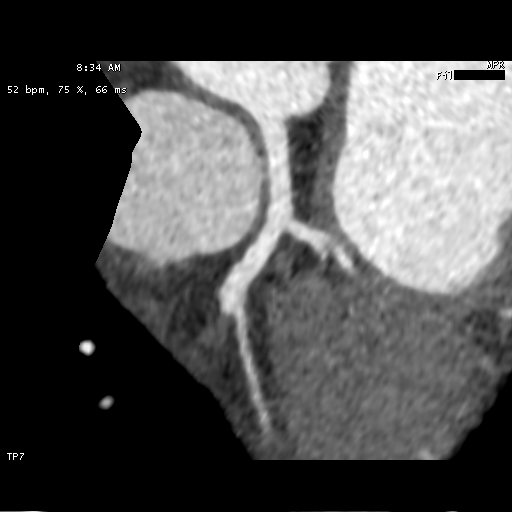
[im 7/16  vessel]
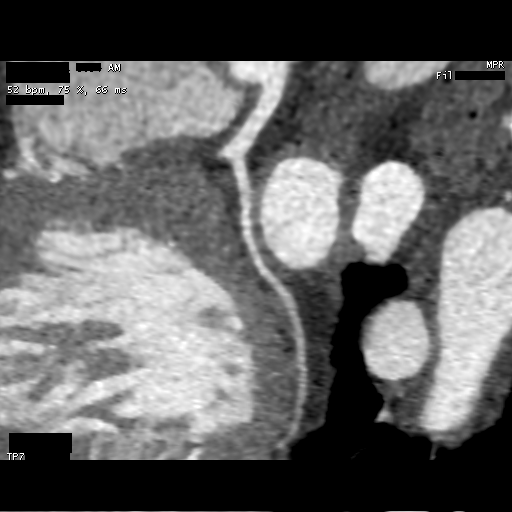
[im 9/16  vessel]
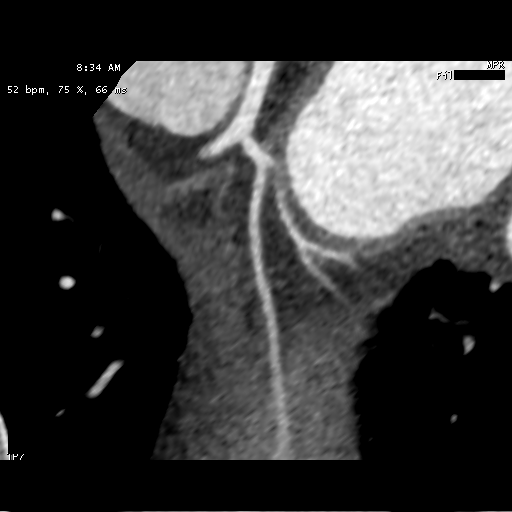
[im 9/16  lung]
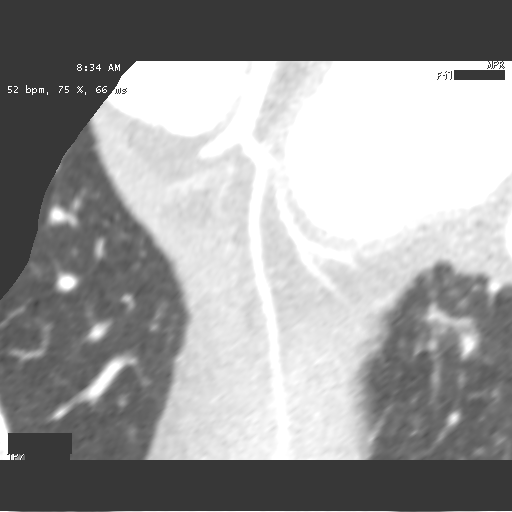
[im 11/16  vessel]
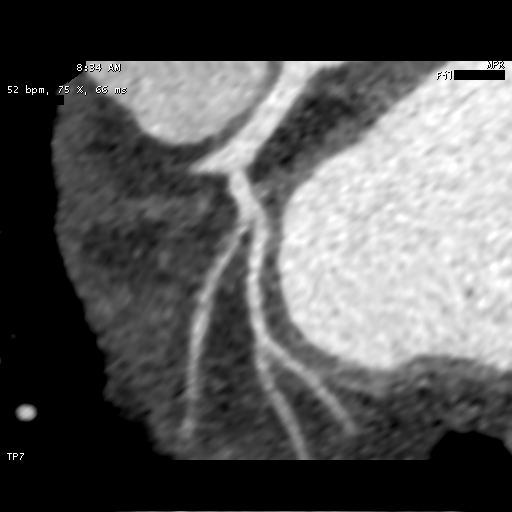
[im 12/16  vessel]
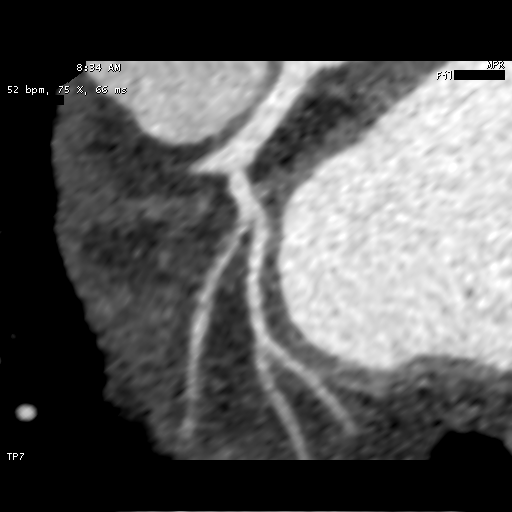
[im 14/16  vessel]
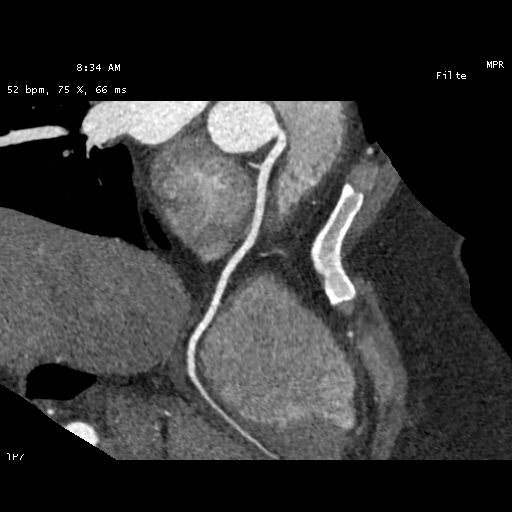

[8 of 20 positions shown; findings below may reference images not displayed]

FINDINGS: Within the visualized portions of the thorax there are no suspicious
appearing pulmonary nodules or masses, there is no acute
consolidative airspace disease, no pleural effusions, no
pneumothorax and no lymphadenopathy. Visualized portions of the
upper abdomen are unremarkable. There are no aggressive appearing
lytic or blastic lesions noted in the visualized portions of the
skeleton.
IMPRESSION: 1. No significant incidental noncardiac findings are noted.
FINDINGS: Image quality: Excellent.

Noise artifact is: Limited.

Coronary Arteries:  Normal coronary origin.  Right dominance.

Left main: The left main is a large caliber vessel with a normal
take off from the left coronary cusp that bifurcates to form a left
anterior descending artery and a left circumflex artery. There is no
plaque or stenosis.

Left anterior descending artery: The LAD is patent without evidence
of plaque or stenosis. The LAD gives off 2 patent diagonal branches.

Left circumflex artery: The LCX is non-dominant and patent with no
evidence of plaque or stenosis. The LCX gives off 3 patent obtuse
marginal branches.

Right coronary artery: The RCA is dominant with normal take off from
the right coronary cusp. There is no evidence of plaque or stenosis.
The RCA terminates as a PDA and right posterolateral branch without
evidence of plaque or stenosis.

Right Atrium: Right atrial size is within normal limits.

Right Ventricle: The right ventricular cavity is within normal
limits.

Left Atrium: Left atrial size is normal in size with no left atrial
appendage filling defect.

Left Ventricle: The ventricular cavity size is within normal limits.
There are no stigmata of prior infarction. There is no abnormal
filling defect.

Pulmonary arteries: Normal in size without proximal filling defect.

Pulmonary veins: Normal pulmonary venous drainage.

Pericardium: Normal thickness with no significant effusion or
calcium present.

Cardiac valves: The aortic valve is trileaflet without significant
calcification. The mitral valve is normal structure without
significant calcification.

Aorta: Normal caliber with no significant disease.

Extra-cardiac findings: See attached radiology report for
non-cardiac structures.
IMPRESSION: 1. Coronary calcium score of 0.

2. Normal coronary origin with right dominance.

3. Normal coronary arteries.

RECOMMENDATIONS:
1. No evidence of CAD (0%). Consider non-atherosclerotic causes of
chest pain.

*** End of Addendum ***
EXAM:
OVER-READ INTERPRETATION  CT CHEST

The following report is an over-read performed by radiologist Dr.
Abenaba Baiden [REDACTED] on 05/18/2020. This
over-read does not include interpretation of cardiac or coronary
anatomy or pathology. The coronary calcium score/coronary CTA
interpretation by the cardiologist is attached.
FINDINGS: Within the visualized portions of the thorax there are no suspicious
appearing pulmonary nodules or masses, there is no acute
consolidative airspace disease, no pleural effusions, no
pneumothorax and no lymphadenopathy. Visualized portions of the
upper abdomen are unremarkable. There are no aggressive appearing
lytic or blastic lesions noted in the visualized portions of the
skeleton.
IMPRESSION: 1. No significant incidental noncardiac findings are noted.

## 2021-11-24 DIAGNOSIS — J45909 Unspecified asthma, uncomplicated: Secondary | ICD-10-CM | POA: Insufficient documentation

## 2021-12-24 DIAGNOSIS — L659 Nonscarring hair loss, unspecified: Secondary | ICD-10-CM | POA: Insufficient documentation

## 2022-03-25 DIAGNOSIS — J301 Allergic rhinitis due to pollen: Secondary | ICD-10-CM | POA: Insufficient documentation

## 2022-07-26 ENCOUNTER — Encounter: Payer: Self-pay | Admitting: Gastroenterology

## 2022-08-02 ENCOUNTER — Other Ambulatory Visit: Payer: Self-pay | Admitting: Internal Medicine

## 2022-08-02 DIAGNOSIS — R519 Headache, unspecified: Secondary | ICD-10-CM

## 2022-08-09 ENCOUNTER — Ambulatory Visit
Admission: RE | Admit: 2022-08-09 | Discharge: 2022-08-09 | Disposition: A | Discharge status: Home / Self Care | Payer: BC Managed Care – PPO | Source: Ambulatory Visit | Attending: Internal Medicine | Admitting: Internal Medicine

## 2022-08-09 DIAGNOSIS — R519 Headache, unspecified: Secondary | ICD-10-CM

## 2022-10-31 HISTORY — PX: REPLACEMENT TOTAL KNEE: SUR1224

## 2022-12-05 ENCOUNTER — Encounter: Payer: Self-pay | Admitting: Gastroenterology

## 2022-12-05 ENCOUNTER — Telehealth: Payer: Self-pay | Admitting: *Deleted

## 2022-12-05 ENCOUNTER — Ambulatory Visit (AMBULATORY_SURGERY_CENTER): Payer: BC Managed Care – PPO | Admitting: *Deleted

## 2022-12-05 VITALS — Ht 65.0 in | Wt 215.0 lb

## 2022-12-05 DIAGNOSIS — Z86010 Personal history of colonic polyps: Secondary | ICD-10-CM

## 2022-12-05 DIAGNOSIS — Z800 Family history of malignant neoplasm of digestive organs: Secondary | ICD-10-CM

## 2022-12-05 MED ORDER — NA SULFATE-K SULFATE-MG SULF 17.5-3.13-1.6 GM/177ML PO SOLN: 1.0000 | Freq: Once | ORAL | 0 refills | Status: AC

## 2022-12-05 NOTE — Progress Notes (Signed)
No egg or soy allergy known to patient  No issues known to pt with past sedation with any surgeries or procedures Patient denies ever being told they had issues or difficulty with intubation  No FH of Malignant Hyperthermia Pt is not on diet pills Pt is not on  home 02  Pt is not on blood thinners  Pt denies issues with constipation  Pt is not on dialysis Pt denies any upcoming cardiac testing Pt encouraged to use to use Singlecare or Goodrx to reduce cost  Patient's chart reviewed by John Nulty CNRA prior to previsit and patient appropriate for the [facility].  Previsit completed and red dot placed by patient's name on their procedure day (on provider's schedule).  . Visit by phone Instructions reviewed with pt and pt states understanding. Instructed to review again prior to procedure. Pt states they will.  Instructions sent by mail and my chart 

## 2022-12-05 NOTE — Telephone Encounter (Signed)
Attempt to reach pt for appt. LM with call back # 

## 2022-12-19 ENCOUNTER — Telehealth: Payer: Self-pay | Admitting: Gastroenterology

## 2022-12-19 NOTE — Telephone Encounter (Signed)
Inbound call from pt ,requesting to speak with a nurse , she have a colon schedule for 02/21 at 8: 00 and she was doing something that instructions says not to and want to know if she have to reschedule appt Please advise

## 2022-12-19 NOTE — Telephone Encounter (Signed)
Just FYI pt called to make [place] aware that she has been taking meloxicam daily. And has had peanuts a few days as well. Pt reports forgetting prep instructions and she has just read her prep instructions today. Pt wanting to know if you're ok with proceeding before she starts her prep tomorrow. Please advice.

## 2022-12-19 NOTE — Telephone Encounter (Signed)
Patient stated that another nurse had called  and was sending message to [place]and[place]am because she has been eating nuts and taking Mobic.

## 2022-12-20 ENCOUNTER — Encounter: Payer: Self-pay | Admitting: Certified Registered Nurse Anesthetist

## 2022-12-20 NOTE — Telephone Encounter (Signed)
Spoke with patient and patient verbally understood directions from D. Nandigam on compliance and completing prep.

## 2022-12-20 NOTE — Telephone Encounter (Signed)
It is fine to proceed if she is able to follow the rest of the instructions and complete the bowel prep.  Thanks

## 2022-12-21 ENCOUNTER — Ambulatory Visit (AMBULATORY_SURGERY_CENTER): Payer: BC Managed Care – PPO | Admitting: Gastroenterology

## 2022-12-21 ENCOUNTER — Encounter: Payer: Self-pay | Admitting: Gastroenterology

## 2022-12-21 VITALS — BP 111/73 | HR 61 | Temp 98.0°F | Resp 20 | Ht 65.0 in | Wt 215.0 lb

## 2022-12-21 DIAGNOSIS — D123 Benign neoplasm of transverse colon: Secondary | ICD-10-CM | POA: Diagnosis not present

## 2022-12-21 DIAGNOSIS — Z800 Family history of malignant neoplasm of digestive organs: Secondary | ICD-10-CM

## 2022-12-21 DIAGNOSIS — K635 Polyp of colon: Secondary | ICD-10-CM

## 2022-12-21 DIAGNOSIS — Z1211 Encounter for screening for malignant neoplasm of colon: Secondary | ICD-10-CM | POA: Diagnosis present

## 2022-12-21 MED ORDER — SODIUM CHLORIDE 0.9 % IV SOLN: 500.0000 mL | Freq: Once | INTRAVENOUS | Status: DC

## 2022-12-21 NOTE — Patient Instructions (Signed)
Thank you for coming in to see [place] today! Resume previous diet and medications/supplements today. Return to regular daily activities tomorrow. Recommend colonoscopy again in 5 years , will confirm once pathology results final.   YOU HAD AN ENDOSCOPIC PROCEDURE TODAY AT [place]:   Refer to the procedure report that was given to you for any specific questions about what was found during the examination.  If the procedure report does not answer your questions, please call your gastroenterologist to clarify.  If you requested that your care partner not be given the details of your procedure findings, then the procedure report has been included in a sealed envelope for you to review at your convenience later.  YOU SHOULD EXPECT: Some feelings of bloating in the abdomen. Passage of more gas than usual.  Walking can help get rid of the air that was put into your GI tract during the procedure and reduce the bloating. If you had a lower endoscopy (such as a colonoscopy or flexible sigmoidoscopy) you may notice spotting of blood in your stool or on the toilet paper. If you underwent a bowel prep for your procedure, you may not have a normal bowel movement for a few days.  Please Note:  You might notice some irritation and congestion in your nose or some drainage.  This is from the oxygen used during your procedure.  There is no need for concern and it should clear up in a day or so.  SYMPTOMS TO REPORT IMMEDIATELY:  Following lower endoscopy (colonoscopy or flexible sigmoidoscopy):  Excessive amounts of blood in the stool  Significant tenderness or worsening of abdominal pains  Swelling of the abdomen that is new, acute  Fever of 100F or higher    For urgent or emergent issues, a gastroenterologist can be reached at any hour by calling [phone]. Do not use MyChart messaging for urgent concerns.    DIET:  We do recommend a small meal at first, but then you may proceed to  your regular diet.  Drink plenty of fluids but you should avoid alcoholic beverages for 24 hours.  ACTIVITY:  You should plan to take it easy for the rest of today and you should NOT DRIVE or use heavy machinery until tomorrow (because of the sedation medicines used during the test).    FOLLOW UP: Our staff will call the number listed on your records the next business day following your procedure.  We will call around 7:15- 8:00 am to check on you and address any questions or concerns that you may have regarding the information given to you following your procedure. If we do not reach you, we will leave a message.     If any biopsies were taken you will be contacted by phone or by letter within the next 1-3 weeks.  Please call us at [phone] if you have not heard about the biopsies in 3 weeks.    SIGNATURES/CONFIDENTIALITY: You and/or your care partner have signed paperwork which will be entered into your electronic medical record.  These signatures attest to the fact that that the information above on your After Visit Summary has been reviewed and is understood.  Full responsibility of the confidentiality of this discharge information lies with you and/or your care-partner.

## 2022-12-21 NOTE — Progress Notes (Signed)
[facility] History and Physical   Primary Care Physician:  Nicoletta Dress, MD   Reason for Procedure:  Family history of colon cancer  Plan:    Screening colonoscopy with possible interventions as needed     HPI: Allison Garza is a very pleasant 52 y.o. female here for screening colonoscopy. Denies any nausea, vomiting, abdominal pain, melena or bright red blood per rectum  The risks and benefits as well as alternatives of endoscopic procedure(s) have been discussed and reviewed. All questions answered. The patient agrees to proceed.    Past Medical History:  Diagnosis Date   Anxiety    GERD (gastroesophageal reflux disease)    Sleep apnea     Past Surgical History:  Procedure Laterality Date   KNEE SURGERY     MYOMECTOMY  2003    Prior to Admission medications   Medication Sig Start Date End Date Taking? Authorizing Provider  albuterol (PROVENTIL HFA;VENTOLIN HFA) 108 (90 Base) MCG/ACT inhaler Inhale 2 puffs into the lungs every 6 (six) hours as needed for wheezing or shortness of breath. 12/11/17  Yes Stallings, Zoe A, MD  Clobetasol Propionate (CLOBETASOL 17 PROPIONATE) 0.5 % POWD Clobetasol 17 Propionate   Yes [provider]  Ergocalciferol (VITAMIN D2 PO) Take 1 mg by mouth as directed. 1.40m   Yes [provider]  hydroxychloroquine (PLAQUENIL) 200 MG tablet Take by mouth daily.   Yes [provider]  levonorgestrel (MIRENA, 52 MG,) 20 MCG/24HR IUD 1 Device by Intrauterine route daily.   Yes [provider]  meloxicam (MOBIC) 15 MG tablet Take 15 mg by mouth daily. 09/04/20  Yes [provider]  minoxidil (ROGAINE) 2 % external solution Apply 1 application topically daily.   Yes [provider]  tacrolimus (PROTOPIC) 0.1 % ointment Apply 1 application topically as needed (scalp irritation).  03/06/20  Yes [provider]  tretinoin (RETIN-A) 0.05 % cream  05/26/20  Yes [provider]  diclofenac Sodium (VOLTAREN) 1 % GEL Apply topically. 06/30/20   [provider]  gabapentin (NEURONTIN) 300 MG capsule Take 300 mg by mouth daily.    [provider]  hydrocortisone 2.5 % cream Apply topically. Patient not taking: Reported on 12/05/2022 05/26/20   [provider]    Current Outpatient Medications  Medication Sig Dispense Refill   albuterol (PROVENTIL HFA;VENTOLIN HFA) 108 (90 Base) MCG/ACT inhaler Inhale 2 puffs into the lungs every 6 (six) hours as needed for wheezing or shortness of breath. 1 Inhaler 2   Clobetasol Propionate (CLOBETASOL 17 PROPIONATE) 0.5 % POWD Clobetasol 17 Propionate     Ergocalciferol (VITAMIN D2 PO) Take 1 mg by mouth as directed. 1.220m    hydroxychloroquine (PLAQUENIL) 200 MG tablet Take by mouth daily.     levonorgestrel (MIRENA, 52 MG,) 20 MCG/24HR IUD 1 Device by Intrauterine route daily.     meloxicam (MOBIC) 15 MG tablet Take 15 mg by mouth daily.     minoxidil (ROGAINE) 2 % external solution Apply 1 application topically daily.     tacrolimus (PROTOPIC) 0.1 % ointment Apply 1 application topically as needed (scalp irritation).      tretinoin (RETIN-A) 0.05 % cream      diclofenac Sodium (VOLTAREN) 1 % GEL Apply topically.     gabapentin (NEURONTIN) 300 MG capsule Take 300 mg by mouth daily.     hydrocortisone 2.5 % cream Apply topically. (Patient not taking: Reported on 12/05/2022)     Current Facility-Administered Medications  Medication Dose Route Frequency Provider Last Rate Last Admin   0.9 %  sodium chloride infusion  500 mL Intravenous Once Mauri Pole, MD        Allergies as of 12/21/2022 - Review Complete 12/21/2022  Allergen Reaction Noted   Penicillins  04/06/2016   Shellfish allergy Hives 04/13/2016    Family History  Problem Relation Age of Onset   Diabetes Mother    Hypertension Mother    Colon polyps Mother    Heart failure Father    Heart disease Father    Heart  attack Maternal Grandfather    Colon cancer Maternal Uncle    Colon cancer Paternal Aunt     Social History   Socioeconomic History   Marital status: Single    Spouse name: Not on file   Number of children: Not on file   Years of education: Not on file   Highest education level: Not on file  Occupational History   Occupation: Professor - Literature  Tobacco Use   Smoking status: Never   Smokeless tobacco: Never  Vaping Use   Vaping Use: Never used  Substance and Sexual Activity   Alcohol use: Yes    Comment: occasionally   Drug use: No   Sexual activity: Yes    Birth control/protection: I.U.D.  Other Topics Concern   Not on file  Social History Narrative   Not on file   Social Determinants of Health   Financial Resource Strain: Not on file  Food Insecurity: Not on file  Transportation Needs: Not on file  Physical Activity: Not on file  Stress: Not on file  Social Connections: Not on file  Intimate Partner Violence: Not on file    Review of Systems:  All other review of systems negative except as mentioned in the HPI.  Physical Exam: Vital signs in last 24 hours: Blood Pressure 134/85   Pulse (Abnormal) 54   Temperature 98 F (36.7 C)   Respiration 17   Height 5' 5"$  (1.651 m)   Weight 215 lb (97.5 kg)   Oxygen Saturation 100%   Body Mass Index 35.78 kg/m  General:   Alert, NAD Lungs:  Clear .   Heart:  Regular rate and rhythm Abdomen:  Soft, nontender and nondistended. Neuro/Psych:  Alert and cooperative. Normal mood and affect. A and O x 3  Reviewed labs, radiology imaging, old records and pertinent past GI work up  Patient is appropriate for planned procedure(s) and anesthesia in an ambulatory setting   K. Denzil Magnuson , MD [phone]

## 2022-12-21 NOTE — Op Note (Signed)
[facility] Patient Name: Allison Garza Procedure Date: 12/21/2022 7:57 AM MRN: [id number] Endoscopist: Mauri Pole , MD, [id number] Age: 52 Referring MD:  Date of Birth: 03-11-1971 Gender: Female Account #: [id number] Procedure:                Colonoscopy Indications:              Screening in patient at increased risk: Family                            history of 1st-degree relative with colorectal                            cancer Medicines:                Monitored Anesthesia Care Procedure:                Pre-Anesthesia Assessment:                           - Prior to the procedure, a History and Physical                            was performed, and patient medications and                            allergies were reviewed. The patient's tolerance of                            previous anesthesia was also reviewed. The risks                            and benefits of the procedure and the sedation                            options and risks were discussed with the patient.                            All questions were answered, and informed consent                            was obtained. Prior Anticoagulants: The patient has                            taken no anticoagulant or antiplatelet agents. ASA                            Grade Assessment: II - A patient with mild systemic                            disease. After reviewing the risks and benefits,                            the patient was deemed in satisfactory condition to  undergo the procedure.                           After obtaining informed consent, the colonoscope                            was passed under direct vision. Throughout the                            procedure, the patient's blood pressure, pulse, and                            oxygen saturations were monitored continuously. The                            Olympus CF-HQ190L [id number] [id number]) Colonoscope was                             introduced through the anus and advanced to the the                            cecum, identified by appendiceal orifice and                            ileocecal valve. The colonoscopy was performed                            without difficulty. The patient tolerated the                            procedure well. The quality of the bowel                            preparation was excellent. The ileocecal valve,                            appendiceal orifice, and rectum were photographed. Scope In: 8:12:14 AM Scope Out: 8:26:16 AM Scope Withdrawal Time: 0 hours 10 minutes 42 seconds  Total Procedure Duration: 0 hours 14 minutes 2 seconds  Findings:                 The perianal and digital rectal examinations were                            normal.                           A 3 mm polyp was found in the transverse colon. The                            polyp was sessile. The polyp was removed with a                            cold snare. Resection and retrieval were complete.  Non-bleeding external and internal hemorrhoids were                            found during retroflexion. The hemorrhoids were                            small.                           The exam was otherwise without abnormality. Complications:            No immediate complications. Estimated Blood Loss:     Estimated blood loss was minimal. Impression:               - One 3 mm polyp in the transverse colon, removed                            with a cold snare. Resected and retrieved.                           - Non-bleeding external and internal hemorrhoids.                           - The examination was otherwise normal. Recommendation:           - Patient has a contact number available for                            emergencies. The signs and symptoms of potential                            delayed complications were discussed with the                             patient. Return to normal activities tomorrow.                            Written discharge instructions were provided to the                            patient.                           - Resume previous diet.                           - Continue present medications.                           - Await pathology results.                           - Repeat colonoscopy in 5 years for surveillance                            based on pathology results. Mauri Pole, MD 12/21/2022 8:33:11 AM This report has been signed electronically.

## 2022-12-21 NOTE — Progress Notes (Signed)
Pt's states no medical or surgical changes since previsit or office visit. 

## 2022-12-21 NOTE — Progress Notes (Signed)
Report given to PACU, vss 

## 2022-12-21 NOTE — Progress Notes (Signed)
Called to room to assist during endoscopic procedure.  Patient ID and intended procedure confirmed with present staff. Received instructions for my participation in the procedure from the performing physician.  

## 2022-12-22 ENCOUNTER — Telehealth: Payer: Self-pay

## 2022-12-22 NOTE — Telephone Encounter (Signed)
  Follow up Call-     12/21/2022    7:23 AM  Call back number  Post procedure Call Back phone  # [phone]  Permission to leave phone message Yes     Patient questions:  Do you have a fever, pain , or abdominal swelling? No. Pain Score  0 *  Have you tolerated food without any problems? Yes.    Have you been able to return to your normal activities? Yes.    Do you have any questions about your discharge instructions: Diet   No. Medications  No. Follow up visit  No.  Do you have questions or concerns about your Care? No.  Actions: * If pain score is 4 or above: No action needed, pain <4.

## 2023-01-11 ENCOUNTER — Encounter: Payer: Self-pay | Admitting: Gastroenterology

## 2023-09-26 ENCOUNTER — Ambulatory Visit (HOSPITAL_BASED_OUTPATIENT_CLINIC_OR_DEPARTMENT_OTHER): Payer: BC Managed Care – PPO | Admitting: Pulmonary Disease

## 2023-09-26 ENCOUNTER — Other Ambulatory Visit (HOSPITAL_COMMUNITY): Payer: Self-pay

## 2023-09-26 ENCOUNTER — Encounter (HOSPITAL_BASED_OUTPATIENT_CLINIC_OR_DEPARTMENT_OTHER): Payer: Self-pay | Admitting: Pulmonary Disease

## 2023-09-26 ENCOUNTER — Other Ambulatory Visit (HOSPITAL_BASED_OUTPATIENT_CLINIC_OR_DEPARTMENT_OTHER): Payer: Self-pay

## 2023-09-26 VITALS — BP 128/80 | HR 72 | Resp 16 | Ht 65.0 in | Wt 220.8 lb

## 2023-09-26 DIAGNOSIS — Z23 Encounter for immunization: Secondary | ICD-10-CM

## 2023-09-26 DIAGNOSIS — J208 Acute bronchitis due to other specified organisms: Secondary | ICD-10-CM | POA: Diagnosis not present

## 2023-09-26 DIAGNOSIS — J4540 Moderate persistent asthma, uncomplicated: Secondary | ICD-10-CM | POA: Diagnosis not present

## 2023-09-26 DIAGNOSIS — R6889 Other general symptoms and signs: Secondary | ICD-10-CM

## 2023-09-26 MED ORDER — FLUTICASONE-SALMETEROL 100-50 MCG/ACT IN AEPB: 1.0000 | INHALATION_SPRAY | Freq: Two times a day (BID) | RESPIRATORY_TRACT | 5 refills | Status: DC

## 2023-09-26 MED ORDER — FLUTICASONE PROPIONATE 50 MCG/ACT NA SUSP: 1.0000 | Freq: Every day | NASAL | 2 refills | Status: AC

## 2023-09-26 MED ORDER — ALBUTEROL SULFATE HFA 108 (90 BASE) MCG/ACT IN AERS: 2.0000 | INHALATION_SPRAY | Freq: Four times a day (QID) | RESPIRATORY_TRACT | 5 refills | Status: AC | PRN

## 2023-09-26 MED ORDER — FLUTICASONE FUROATE-VILANTEROL 100-25 MCG/ACT IN AEPB: 1.0000 | INHALATION_SPRAY | Freq: Every day | RESPIRATORY_TRACT | 5 refills | Status: DC

## 2023-09-26 MED ORDER — CETIRIZINE HCL 10 MG PO TABS: 10.0000 mg | ORAL_TABLET | Freq: Every day | ORAL | 3 refills | Status: AC

## 2023-09-26 NOTE — Progress Notes (Signed)
Subjective:   PATIENT ID: Allison Garza GENDER: female DOB: December 06, 1970, MRN: [id number]  Chief Complaint  Patient presents with   Consult    Asthma. Establish care locally. Dx with Asthma in 2021. Needs refills-has no inhaler at this time. Has had episodes where she can't breathe. Usually happens this time of year and lasts months. Allergies is a huge exacerbation. Allergic to all grass, cats, dogs etc. Was on Breo and she feels like it helped the best.     Reason for Visit: New consult for asthma  Allison Garza is a 52 year old with asthma, allergic rhinitis, fibromyalgia, moderate OSA not on CPAP who presents as a new consult for asthma to establish care.  Previously followed by Dr. Donnie Aho at [facility] in [place] for asthma and [facility]. Allergy panel +. She was diagnosed with asthma in 2021. Hx of severe symptoms in 2022/2023. She was previously on Breo 100 with good control. Has been off of maintenance medications 2-3 months. Reports fall/winter are her worse months. Usually has bronchitis every other year requiring zpacks. Previously traveled back and forth between [place] and [place] for work and seemingly to retrigger her asthma symptoms. Reported respiratory symptoms since childhood but never a formal diagnosis of asthma.   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  Past Medical History:  Diagnosis Date   Alopecia    Anxiety    Asthma    GERD (gastroesophageal reflux disease)    Sleep apnea      Family History  Problem Relation Age of Onset   Diabetes Mother    Hypertension Mother    Colon polyps Mother    Heart failure Father    Heart disease Father    Heart attack Maternal Grandfather    Colon cancer Maternal Uncle    Colon cancer Paternal Aunt      Social History   Occupational History   Occupation: Professor -  Literature  Tobacco Use   Smoking status: Never   Smokeless tobacco: Never  Vaping Use   Vaping status: Never Used  Substance and Sexual Activity   Alcohol use: Yes    Alcohol/week: 7.0 standard drinks of alcohol    Types: 7 Glasses of wine per week    Comment: occasionally a bottle a week every so often   Drug use: No   Sexual activity: Yes    Birth control/protection: I.U.D.    Allergies  Allergen Reactions   Penicillins    Shellfish Allergy Hives     Outpatient Medications Prior to Visit  Medication Sig Dispense Refill   Ergocalciferol (VITAMIN D2 PO) Take 1 mg by mouth as directed. 1.25mg      levonorgestrel (MIRENA, 52 MG,) 20 MCG/24HR IUD 1 Device by Intrauterine route daily.     minoxidil (ROGAINE) 2 % external solution Apply 1 application topically daily.     tacrolimus (PROTOPIC) 0.1 % ointment Apply 1 application topically as needed (scalp irritation).      tretinoin (RETIN-A) 0.05 % cream      albuterol (PROVENTIL HFA;VENTOLIN HFA) 108 (90 Base) MCG/ACT inhaler Inhale 2 puffs into the lungs every 6 (six) hours as needed for wheezing or shortness of breath. 1 Inhaler 2   fluticasone furoate-vilanterol (BREO ELLIPTA) 100-25 MCG/ACT AEPB Inhale 1 puff into the lungs daily.     Clobetasol Propionate (CLOBETASOL 17 PROPIONATE) 0.5 % POWD Clobetasol 17 Propionate  diclofenac Sodium (VOLTAREN) 1 % GEL Apply topically.     hydrocortisone 2.5 % cream Apply topically. (Patient not taking: Reported on 12/05/2022)     hydroxychloroquine (PLAQUENIL) 200 MG tablet Take by mouth daily.     meloxicam (MOBIC) 15 MG tablet Take 15 mg by mouth daily.     No facility-administered medications prior to visit.    Review of Systems  Constitutional:  Negative for chills, diaphoresis, fever, malaise/fatigue and weight loss.  HENT:  Negative for congestion.   Respiratory:  Negative for cough, hemoptysis, sputum production, shortness of breath and wheezing.   Cardiovascular:  Negative for  chest pain, palpitations and leg swelling.     Objective:   Vitals:   09/26/23 1115  BP: 128/80  Pulse: 72  Resp: 16  SpO2: 99%  Weight: 220 lb 12.8 oz (100.2 kg)  Height: 5\' 5"  (1.651 m)   SpO2: 99 %  Physical Exam: General: Well-appearing, no acute distress HENT: [place], AT Eyes: EOMI, no scleral icterus Respiratory: Clear to auscultation bilaterally.  No crackles, wheezing or rales Cardiovascular: RRR, -M/R/G, no JVD Extremities:-Edema,-tenderness Neuro: AAO x4, CNII-XII grossly intact Psych: Normal mood, normal affect  Data Reviewed:  Imaging: CXR 04/09/20 - No infiltrate effusion o edema  PFT: None on file  Labs:  Comprehensive Environmental Panel (05/13/2020 4:30 PM EDT) Lab Results - Comprehensive Environmental Panel (05/13/2020 4:30 PM EDT) Component Value Ref Range Performed At Pathologist Signature  Cocklebur IgE 3.21 (H) <0.35 kUA/L [facility]    Cat dander IgE 4.74 (H) <0.35 kUA/L [facility]    C[place]wood ([place]) Tree IgE 0.60 (H) <0.35 kUA/L [facility]    Dog Dander IgE 0.91 (H) <0.35 kUA/L [facility]    D. farinae IgE 9.67 (H) <0.35 kUA/L [facility]    D. pteronyssinus IgE 7.46 (H) <0.35 kUA/L [facility]    Bahia Grass IgE 2.00 (H) <0.35 kUA/L [facility]    Johnson Grass IgE 1.06 (H) <0.35 kUA/L [facility]    Timothy Grass IgE 4.38 (H) <0.35 kUA/L [facility]    Alternaria alternata IgE 1.84 (H) <0.35 kUA/L [facility]    Candida Albicans IgE <0.35 <0.35 kUA/L [facility]    Cladosporium Herbarum IgE 1.17 (H) <0.35 kUA/L [facility]    Epicoccum purpurascens IgE 2.86 (H) <0.35 kUA/L [facility]    Fusarium Proliferatum IgE 0.99 (H)  <0.35 kUA/L [facility]    Aspergillus fumigatus IgE <0.35 <0.35 kUA/L [facility]    Mucor Racemosus IgE <0.35 <0.35 kUA/L [facility]    Aspergillus [place] IgE <0.35 <0.35 kUA/L [facility]    Mouse IgE <0.35 <0.35 kUA/L [facility]    P chrysogenum (P notatum) IgE <0.35 <0.35 kUA/L [facility]    Rhizopus nigrican IgE <0.35 <0.35 kUA/L [facility]    Trichophyton rubrum IgE <0.35 <0.35 kUA/L [facility]    Setomelanomma rostrata (H. halodes) IgE 1.87 (H) <0.35 kUA/L [facility]    Mugwort IgE <0.35 <0.35 kUA/L [facility]    Pigweed, Common IgE 0.50 (H) <0.35 kUA/L [facility]    English Plantain IgE 0.59 (H) <0.35 kUA/L [facility]    Giant Ragweed IgE <0.35 <0.35 kUA/L [facility]    German Cockroach IgE <0.35 <0.35 kUA/L [facility]    Sheep Sorrel IgE 1.11 (H) <0.35 kUA/L [facility]    Ragweed, short (common) IgE 5.95 (H) <0.35 kUA/L [facility]    White Ash Tree IgE 2.07 (H) <0.35 kUA/L [facility]    Beech (American) tree IgE 8.85 (H) <0.35 kUA/L [facility]    Birch (Common Silver) tree IgE 14.0 (H) <0.35 kUA/L [facility]    Box Elder Tree IgE 3.81 (H) <0.35 kUA/L [facility]    Elm Tree IgE 4.32 (H) <0.35 kUA/L [facility]    Maple Leaf Sycamore IgE 1.40 (H) <0.35 kUA/L [facility]    White Oak Tree IgE 8.70 (H) <0.35 kUA/L [facility]    Pecan Hickory IgE 11.9 (H) <0.35 kUA/L [facility]    Walnut Tree IgE 7.26 (H)  <0.35 kUA/L [facility]    [place] Grass IgE 1.03 (H) <0.35 kUA/L [facility]    Goosefoot (Lamb[place] Quarters) IgE 1.29 (H) <0.35 kUA/L [facility]    Willow tree IgE 0.72 (H) <0.35 kUA/L [facility]     IgE Total (05/13/2020 4:30 PM EDT) Lab Results - IgE Total (05/13/2020 4:30 PM EDT) Component Value Ref Range Performed At Pathologist Signature  IgE, Total 471 (H)         Assessment & Plan:   Discussion: 52 year old with asthma, allergic rhinitis, fibromyalgia, moderate OSA not on CPAP who presents as a new consult for asthma to establish care. Discussed clinical course and management of asthma including bronchodilator regimen, preventive care and action plan for exacerbation.   Moderate persistent asthma --RESTART Breo 100-25 mcg ONE puff ONCE a day. Rinse mouth out after use --CONTINUE Albuterol TWO puffs AS NEEDED for shortness of breath or wheezing  --Consider pulmonary function tests  Health Maintenance Immunization History  Administered Date(s) Administered   Influenza,inj,Quad PF,6+ Mos 10/30/2017   Moderna Covid-19 Vaccine Bivalent Booster 106yrs & up 08/01/2021   Moderna SARS-COV2 Booster Vaccination 09/15/2020   CT Lung Screen - not qualified . Never smoker  Orders Placed This Encounter  Procedures   Flu vaccine trivalent PF, 6mos and older(Flulaval,Afluria,Fluarix,Fluzone)   Meds ordered this encounter  Medications   albuterol (VENTOLIN HFA) 108 (90 Base) MCG/ACT inhaler    Sig: Inhale 2 puffs into the lungs every 6 (six) hours as needed for wheezing or shortness of breath.    Dispense:  9 each    Refill:  5   fluticasone furoate-vilanterol (BREO ELLIPTA) 100-25 MCG/ACT AEPB    Sig: Inhale 1 puff into the lungs daily.    Dispense:  60 each    Refill:  5   cetirizine (ZYRTEC) 10 MG tablet    Sig: Take 1 tablet (10 mg total) by mouth daily.    Dispense:  90 tablet     Refill:  3    Return in about 3 months (around 12/27/2023).  I have spent a total time of 45-minutes on the day of the appointment reviewing prior documentation, coordinating care and discussing medical diagnosis and plan with the patient/family. Imaging, labs and tests included in this note have been reviewed and interpreted independently by me.  Annleigh Knueppel Mechele Collin, MD [facility] 09/26/2023 11:44 AM

## 2023-09-26 NOTE — Patient Instructions (Signed)
Moderate persistent asthma --RESTART Breo 100-25 mcg ONE puff ONCE a day. Rinse mouth out after use --CONTINUE Albuterol TWO puffs AS NEEDED for shortness of breath or wheezing  --Consider pulmonary function tests in the future

## 2023-12-27 ENCOUNTER — Ambulatory Visit (HOSPITAL_BASED_OUTPATIENT_CLINIC_OR_DEPARTMENT_OTHER): Payer: BC Managed Care – PPO | Admitting: Pulmonary Disease

## 2024-01-18 ENCOUNTER — Ambulatory Visit (HOSPITAL_BASED_OUTPATIENT_CLINIC_OR_DEPARTMENT_OTHER): Payer: BC Managed Care – PPO | Admitting: Pulmonary Disease

## 2024-02-22 ENCOUNTER — Ambulatory Visit (HOSPITAL_BASED_OUTPATIENT_CLINIC_OR_DEPARTMENT_OTHER): Admitting: Pulmonary Disease

## 2024-02-22 ENCOUNTER — Encounter (HOSPITAL_BASED_OUTPATIENT_CLINIC_OR_DEPARTMENT_OTHER): Payer: Self-pay | Admitting: Pulmonary Disease

## 2024-02-22 VITALS — BP 120/78 | HR 81 | Ht 65.0 in | Wt 225.6 lb

## 2024-02-22 DIAGNOSIS — J4540 Moderate persistent asthma, uncomplicated: Secondary | ICD-10-CM | POA: Diagnosis not present

## 2024-02-22 MED ORDER — OLOPATADINE HCL 0.2 % OP SOLN: OPHTHALMIC | 2 refills | Status: AC

## 2024-02-22 MED ORDER — FLUTICASONE-SALMETEROL 100-50 MCG/ACT IN AEPB: 1.0000 | INHALATION_SPRAY | Freq: Two times a day (BID) | RESPIRATORY_TRACT | 5 refills | Status: AC

## 2024-02-22 NOTE — Progress Notes (Signed)
 Subjective:   PATIENT ID: Everett Hitt GENDER: female DOB: [birth year], MRN: [id number]  Chief Complaint  Patient presents with   Follow-up    Asthma    Reason for Visit: Follow-up asthma  Ms. Autry Droege is a 53 year old with asthma, allergic rhinitis, fibromyalgia, moderate OSA not on CPAP who presents for asthma follow-up.  Initial consult Previously followed by Dr. Anastasia Balo at [facility] in [place] for asthma and [facility]. Allergy panel +. She was diagnosed with asthma in 2021. Hx of severe symptoms in 2022/2023. She was previously on Breo 100 with good control. Has been off of maintenance medications 2-3 months. Reports fall/winter are her worse months. Usually has bronchitis every other year requiring zpacks. Previously traveled back and forth between [place]  and [place] for work and seemingly to retrigger her asthma symptoms. Reported respiratory symptoms since childhood but never a formal diagnosis of asthma.   02/22/24 Since our last visit she was started on Wixela twice a daily. Has used albuterol  up to 1-2 times a week in the last couple of weeks. Mornings are when she notices shortness of breath. Denies nocturnal symptoms. Denies exacerbations since our last visit. Usually triggered in fall/winter. No longer taking cetrizine due to starting hydroxyzine due to anxiety with her recent sister's passing. Using flonase  for nasal congestion. Has eye redness.  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[place], [place]   Past Medical History:  Diagnosis Date   Alopecia    Anxiety    Asthma    GERD (gastroesophageal reflux disease)    Sleep apnea      Family History  Problem Relation Age of Onset   Diabetes Mother    Hypertension Mother    Colon polyps Mother    Heart failure Father    Heart disease Father    Heart attack Maternal Grandfather    Colon cancer Maternal Uncle    Colon cancer Paternal Aunt      Social History   Occupational History    Occupation: Professor - Literature  Tobacco Use   Smoking status: Never   Smokeless tobacco: Never  Vaping Use   Vaping status: Never Used  Substance and Sexual Activity   Alcohol use: Yes    Alcohol/week: 7.0 standard drinks of alcohol    Types: 7 Glasses of wine per week    Comment: occasionally a bottle a week every so often   Drug use: No   Sexual activity: Yes    Birth control/protection: I.U.D.    Allergies  Allergen Reactions   Penicillins    Shellfish Allergy Hives     Outpatient Medications Prior to Visit  Medication Sig Dispense Refill   albuterol  (VENTOLIN  HFA) 108 (90 Base) MCG/ACT inhaler Inhale 2 puffs into the lungs every 6 (six) hours as needed for wheezing or shortness of breath. 9 each 5   Ergocalciferol (VITAMIN D2 PO) Take 1 mg by mouth as directed. 1.25mg      fluticasone  (FLONASE ) 50 MCG/ACT nasal spray Place 1-2 sprays into both nostrils daily. 16 g 2   hydrOXYzine (ATARAX) 10 MG tablet Take 10-20 mg by mouth 2 (two) times daily as needed.     levonorgestrel (MIRENA, 52 MG,) 20 MCG/24HR IUD 1 Device by Intrauterine route daily.     minoxidil (ROGAINE) 2 % external solution Apply 1 application topically daily.     tacrolimus (PROTOPIC) 0.1 % ointment Apply 1 application topically as needed (scalp irritation).      tretinoin (RETIN-A)  0.05 % cream      fluticasone -salmeterol (WIXELA INHUB) 100-50 MCG/ACT AEPB Inhale 1 puff into the lungs 2 (two) times daily. 60 each 5   cetirizine  (ZYRTEC ) 10 MG tablet Take 1 tablet (10 mg total) by mouth daily. (Patient not taking: Reported on 02/22/2024) 90 tablet 3   No facility-administered medications prior to visit.    Review of Systems  Constitutional:  Negative for chills, diaphoresis, fever, malaise/fatigue and weight loss.  HENT:  Negative for congestion.   Respiratory:  Positive for shortness of breath. Negative for cough, hemoptysis, sputum production and wheezing.   Cardiovascular:  Negative for chest pain,  palpitations and leg swelling.     Objective:   Vitals:   02/22/24 0853  BP: 120/78  Pulse: 81  SpO2: 99%  Weight: 225 lb 9.6 oz (102.3 kg)  Height: 5\' 5"  (1.651 m)    SpO2: 99 %  Physical Exam: General: Well-appearing, no acute distress HENT: [place], AT Eyes: EOMI, no scleral icterus Respiratory: Clear to auscultation bilaterally.  No crackles, wheezing or rales Cardiovascular: RRR, -M/R/G, no JVD Extremities:-Edema,-tenderness Neuro: AAO x4, CNII-XII grossly intact Psych: Normal mood, normal affect  Data Reviewed:  Imaging: CXR 04/09/20 - No infiltrate effusion o edema  PFT: None on file  Labs:  Comprehensive Environmental Panel (05/13/2020 4:30 PM EDT) Lab Results - Comprehensive Environmental Panel (05/13/2020 4:30 PM EDT) Component Value Ref Range Performed At Pathologist Signature  Cocklebur IgE 3.21 (H) <0.35 kUA/L [facility]    Cat dander IgE 4.74 (H) <0.35 kUA/L [facility]    Cottonwood (White Poplar) Tree IgE 0.60 (H) <0.35 kUA/L [facility]    Dog Dander IgE 0.91 (H) <0.35 kUA/L [facility]    D. farinae IgE 9.67 (H) <0.35 kUA/L [facility]    D. pteronyssinus IgE 7.46 (H) <0.35 kUA/L [facility]    Bahia Grass IgE 2.00 (H) <0.35 kUA/L [facility]    Johnson Grass IgE 1.06 (H) <0.35 kUA/L [facility]    Timothy Grass IgE 4.38 (H) <0.35 kUA/L [facility]    Alternaria alternata IgE 1.84 (H) <0.35 kUA/L [facility]    Candida Albicans IgE <0.35 <0.35 kUA/L [facility]    Cladosporium Herbarum IgE 1.17 (H) <0.35 kUA/L [facility]    Epicoccum purpurascens IgE 2.86 (H) <0.35 kUA/L [facility]    Fusarium Proliferatum IgE 0.99 (H) <0.35 kUA/L [facility]    Aspergillus fumigatus IgE <0.35 <0.35 kUA/L [facility]    Mucor Racemosus IgE <0.35 <0.35 kUA/L [facility]    Aspergillus [place]bourg IgE <0.35 <0.35 kUA/L [facility]    Mouse IgE <0.35 <0.35 kUA/L [facility]    P chrysogenum (P notatum) IgE <0.35 <0.35 kUA/L [facility]    Rhizopus nigrican IgE <0.35 <0.35 kUA/L [facility]    Trichophyton rubrum IgE <0.35 <0.35 kUA/L [facility]    Setomelanomma rostrata (H. halodes) IgE 1.87 (H) <0.35 kUA/L [facility]    Mugwort IgE <0.35 <0.35 kUA/L [facility]    Pigweed, Common IgE 0.50 (H) <0.35 kUA/L [facility]    English Plantain IgE 0.59 (H) <0.35 kUA/L [facility]    Giant Ragweed IgE <0.35 <0.35 kUA/L [facility]    German Cockroach IgE <0.35 <0.35 kUA/L [facility]    Sheep Sorrel IgE 1.11 (H) <0.35 kUA/L [facility]    Ragweed, short (common) IgE 5.95 (H) <0.35 kUA/L [facility]    White Ash Tree IgE 2.07 (H) <0.35 kUA/L [facility]    Beech (American) tree IgE 8.85 (H) <0.35 kUA/L [facility]    Birch (Common Silver) tree IgE 14.0 (H) <0.35 kUA/L [facility]    Box Elder Tree IgE 3.81 (H) <0.35 kUA/L [facility]    Elm Tree IgE 4.32 (H) <0.35 kUA/L [facility]    Maple Leaf Sycamore IgE 1.40 (H) <0.35 kUA/L [facility]    White Oak Tree IgE 8.70 (H) <0.35 kUA/L [facility]    Pecan Hickory IgE 11.9 (H) <0.35 kUA/L [facility]    Walnut Tree IgE 7.26 (H) <0.35 kUA/L [facility]    [place] Grass IgE 1.03 (H) <0.35 kUA/L [facility]    Goosefoot (Lamb's Quarters) IgE 1.29 (H) <0.35 kUA/L [facility]    Willow tree IgE 0.72 (H) <0.35 kUA/L [facility]     IgE Total (05/13/2020 4:30 PM EDT) Lab Results - IgE Total (05/13/2020 4:30 PM EDT) Component Value Ref Range Performed At Pathologist Signature  IgE, Total 471 (H)         Assessment & Plan:   Discussion: 53 year old with asthma, allergic rhinitis, fibromyalgia, moderate OSA not on CPAP who presents for asthma follow-up. Overall well controlled with minimal symptoms. Discussed clinical course and management of asthma including bronchodilator regimen and action plan for exacerbation.   Moderate persistent asthma --CONTINUE Wixela 100-50 mcg ONE puff TWICE a day. Rinse mouth out after use. REFILLED --CONTINUE Albuterol  TWO puffs AS NEEDED for shortness of breath or wheezing  --Consider pulmonary function tests  Allergic Rhinitis --Continue flonase  daily as needed --START Olopatadine  0.2%. Instill 1 drop into each affected eye once daily.   Health Maintenance Immunization History  Administered Date(s) Administered   Influenza, Seasonal, Injecte, Preservative Fre 09/26/2023   Influenza,inj,Quad PF,6+ Mos 10/30/2017   Moderna Covid-19 Vaccine Bivalent Booster 33yrs & up 08/01/2021   Moderna SARS-COV2 Booster Vaccination 09/15/2020   CT Lung Screen - not qualified . Never smoker  No orders of the defined types were placed in this encounter.  Meds ordered this encounter  Medications   Olopatadine  HCl 0.2 % SOLN    Sig: Instill 1 drop into each affected eye once daily.    Dispense:  2.5 mL    Refill:  2   fluticasone -salmeterol (WIXELA INHUB) 100-50 MCG/ACT AEPB    Sig: Inhale 1 puff into the lungs 2 (two) times daily.    Dispense:  60 each    Refill:  5    Return in about 6 months (around  08/23/2024).  I have spent a total time of 30-minutes on the day of the appointment including chart review, data review, collecting history, coordinating care and discussing medical diagnosis and plan with the patient/family. Past medical history, allergies, medications were reviewed. Pertinent imaging, labs and tests included in this note have been reviewed and interpreted independently by me.  Cerissa Zeiger Genetta Kenning, MD [facility] 02/22/2024 9:21 AM

## 2024-02-22 NOTE — Patient Instructions (Signed)
 Moderate persistent asthma --CONTINUE Wixela 100-50 mcg ONE puff TWICE a day. Rinse mouth out after use --CONTINUE Albuterol  TWO puffs AS NEEDED for shortness of breath or wheezing  --Consider pulmonary function tests  Allergic Rhinitis --Continue flonase  daily as needed --START Olopatadine  0.2%. Instill 1 drop into each affected eye once daily.

## 2024-07-10 ENCOUNTER — Encounter (HOSPITAL_BASED_OUTPATIENT_CLINIC_OR_DEPARTMENT_OTHER): Payer: Self-pay | Admitting: Pulmonary Disease

## 2024-07-11 MED ORDER — COVID-19 MRNA VAC-TRIS(PFIZER) 30 MCG/0.3ML IM SUSY: 0.3000 mL | PREFILLED_SYRINGE | Freq: Once | INTRAMUSCULAR | 0 refills | Status: AC

## 2024-07-11 NOTE — Telephone Encounter (Signed)
**Note De-identified  [place] Obfuscation** Please advise 

## 2024-07-11 NOTE — Telephone Encounter (Signed)
Pt notified on VM. 

## 2024-08-16 ENCOUNTER — Encounter (HOSPITAL_BASED_OUTPATIENT_CLINIC_OR_DEPARTMENT_OTHER): Payer: Self-pay | Admitting: Pulmonary Disease

## 2024-08-16 ENCOUNTER — Ambulatory Visit (HOSPITAL_BASED_OUTPATIENT_CLINIC_OR_DEPARTMENT_OTHER): Admitting: Pulmonary Disease

## 2024-08-16 VITALS — BP 142/80 | HR 69 | Ht 65.0 in | Wt 230.0 lb

## 2024-08-16 DIAGNOSIS — J4540 Moderate persistent asthma, uncomplicated: Secondary | ICD-10-CM | POA: Diagnosis not present

## 2024-08-16 DIAGNOSIS — J309 Allergic rhinitis, unspecified: Secondary | ICD-10-CM

## 2024-08-16 DIAGNOSIS — J45909 Unspecified asthma, uncomplicated: Secondary | ICD-10-CM

## 2024-08-16 NOTE — Progress Notes (Signed)
 Subjective:   PATIENT ID: Allison Garza GENDER: female DOB: 08-Mar-1971, MRN: [id number]  Chief Complaint  Patient presents with   Asthma    Follow up     Reason for Visit: Follow-up asthma  Ms. Allison Garza is a 53 year old with asthma, allergic rhinitis, fibromyalgia, moderate OSA not on CPAP who presents for asthma follow-up.  Initial consult Previously followed by Dr. Blanca at [facility] in [place] for asthma and [facility]. Allergy panel +. She was diagnosed with asthma in 2021. Hx of severe symptoms in 2022/2023. She was previously on Breo 100 with good control. Has been off of maintenance medications 2-3 months. Reports fall/winter are her worse months. Usually has bronchitis every other year requiring zpacks. Previously traveled back and forth between [place]  and [place] for work and seemingly to retrigger her asthma symptoms. Reported respiratory symptoms since childhood but never a formal diagnosis of asthma.   02/22/24 Since our last visit she was started on Wixela twice a daily. Has used albuterol  up to 1-2 times a week in the last couple of weeks. Mornings are when she notices shortness of breath. Denies nocturnal symptoms. Denies exacerbations since our last visit. Usually triggered in fall/winter. No longer taking cetrizine due to starting hydroxyzine due to anxiety with her recent sister's passing. Using flonase  for nasal congestion. Has eye redness.  08/16/24 Since our last visit she reports overall asthma well controlled and able to reduce wixela once a day. Fall/winter are her worse symptoms so has restarted twice a day. Denies coughing and wheezing. Rarely has shortness of breath but improved with albuterol .  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[place], [place]   Past Medical History:  Diagnosis Date   Alopecia    Anxiety    Asthma    GERD  (gastroesophageal reflux disease)    Sleep apnea      Family History  Problem Relation Age of Onset   Diabetes Mother    Hypertension Mother    Colon polyps Mother    Heart failure Father    Heart disease Father    Heart attack Maternal Grandfather    Colon cancer Maternal Uncle    Colon cancer Paternal Aunt      Social History   Occupational History   Occupation: Professor - Literature  Tobacco Use   Smoking status: Never   Smokeless tobacco: Never  Vaping Use   Vaping status: Never Used  Substance and Sexual Activity   Alcohol use: Yes    Alcohol/week: 7.0 standard drinks of alcohol    Types: 7 Glasses of wine per week    Comment: occasionally a bottle a week every so often   Drug use: No   Sexual activity: Yes    Birth control/protection: I.U.D.    Allergies  Allergen Reactions   Penicillins    Shellfish Allergy Hives     Outpatient Medications Prior to Visit  Medication Sig Dispense Refill   albuterol  (VENTOLIN  HFA) 108 (90 Base) MCG/ACT inhaler Inhale 2 puffs into the lungs every 6 (six) hours as needed for wheezing or shortness of breath. 9 each 5   cetirizine  (ZYRTEC ) 10 MG tablet Take 1 tablet (10 mg total) by mouth daily. 90 tablet 3   Ergocalciferol (VITAMIN D2 PO) Take 1 mg by mouth as directed. 1.25mg      fluticasone  (FLONASE ) 50 MCG/ACT nasal spray  Place 1-2 sprays into both nostrils daily. 16 g 2   fluticasone -salmeterol (WIXELA INHUB) 100-50 MCG/ACT AEPB Inhale 1 puff into the lungs 2 (two) times daily. 60 each 5   hydrOXYzine (ATARAX) 10 MG tablet Take 10-20 mg by mouth 2 (two) times daily as needed.     levonorgestrel (MIRENA, 52 MG,) 20 MCG/24HR IUD 1 Device by Intrauterine route daily.     minoxidil (ROGAINE) 2 % external solution Apply 1 application topically daily.     Olopatadine  HCl 0.2 % SOLN Instill 1 drop into each affected eye once daily. 2.5 mL 2   tacrolimus (PROTOPIC) 0.1 % ointment Apply 1 application topically as needed (scalp  irritation).      tretinoin (RETIN-A) 0.05 % cream      No facility-administered medications prior to visit.    Review of Systems  Constitutional:  Negative for chills, diaphoresis, fever, malaise/fatigue and weight loss.  HENT:  Negative for congestion.   Respiratory:  Positive for shortness of breath. Negative for cough, hemoptysis, sputum production and wheezing.   Cardiovascular:  Negative for chest pain, palpitations and leg swelling.     Objective:   Vitals:   08/16/24 1013  BP: 136/80  Pulse: 69  SpO2: 99%  Weight: 230 lb (104.3 kg)  Height: 5' 5 (1.651 m)    SpO2: 99 %  Physical Exam: General: Well-appearing, no acute distress HENT: , AT Eyes: EOMI, no scleral icterus Respiratory: Clear to auscultation bilaterally.  No crackles, wheezing or rales Cardiovascular: RRR, -M/R/G, no JVD Extremities:-Edema,-tenderness Neuro: AAO x4, CNII-XII grossly intact Psych: Normal mood, normal affect  Data Reviewed:  Imaging: CXR 04/09/20 - No infiltrate effusion o edema  PFT: None on file  Labs:  Comprehensive Environmental Panel (05/13/2020 4:30 PM EDT) Lab Results - Comprehensive Environmental Panel (05/13/2020 4:30 PM EDT) Component Value Ref Range Performed At Pathologist Signature  Cocklebur IgE 3.21 (H) <0.35 kUA/L [facility]    Cat dander IgE 4.74 (H) <0.35 kUA/L [facility]    C[place] (White Poplar) Tree IgE 0.60 (H) <0.35 kUA/L [facility]    Dog Dander IgE 0.91 (H) <0.35 kUA/L [facility]    D. farinae IgE 9.67 (H) <0.35 kUA/L [facility]    D. pteronyssinus IgE 7.46 (H) <0.35 kUA/L [facility]    Bahia Grass IgE 2.00 (H) <0.35 kUA/L [facility]    Johnson Grass IgE 1.06 (H) <0.35 kUA/L [facility]    Timothy Grass IgE 4.38 (H) <0.35 kUA/L [facility]    Alternaria alternata IgE 1.84 (H) <0.35 kUA/L [facility]    Candida Albicans IgE <0.35 <0.35 kUA/L [facility]    Cladosporium Herbarum IgE 1.17 (H) <0.35 kUA/L [facility]    Epicoccum purpurascens IgE 2.86 (H) <0.35 kUA/L [facility]    Fusarium Proliferatum IgE 0.99 (H) <0.35 kUA/L [facility]    Aspergillus fumigatus IgE <0.35 <0.35 kUA/L [facility]    Mucor Racemosus IgE <0.35 <0.35 kUA/L [facility]    Aspergillus luxembourg IgE <0.35 <0.35 kUA/L [facility]    Mouse IgE <0.35 <0.35 kUA/L [facility]    P chrysogenum (P notatum) IgE <0.35 <0.35 kUA/L [facility]    Rhizopus nigrican IgE <0.35 <0.35 kUA/L [facility]    Trichophyton rubrum  IgE <0.35 <0.35 kUA/L [facility]    Setomelanomma rostrata (H. halodes) IgE 1.87 (H) <0.35 kUA/L [facility]    Mugwort IgE <0.35 <0.35 kUA/L [facility]    Pigweed, Common IgE 0.50 (H) <0.35 kUA/L [facility]    English Plantain IgE 0.59 (H) <0.35 kUA/L [facility]    Giant Ragweed IgE <0.35 <0.35 kUA/L [facility]    German Cockroach IgE <0.35 <0.35 kUA/L [facility]    Sheep Sorrel IgE 1.11 (H) <0.35 kUA/L [facility]    Ragweed, short (common) IgE 5.95 (H) <0.35 kUA/L [facility]    White Ash Tree IgE 2.07 (H) <0.35 kUA/L [facility]    Beech (American) tree IgE 8.85 (H) <0.35 kUA/L [facility]    Birch (Common Silver) tree IgE 14.0 (H) <0.35 kUA/L [facility]    Box Elder Tree IgE 3.81 (H)  <0.35 kUA/L [facility]    Elm Tree IgE 4.32 (H) <0.35 kUA/L [facility]    Maple Leaf Sycamore IgE 1.40 (H) <0.35 kUA/L [facility]    White Oak Tree IgE 8.70 (H) <0.35 kUA/L [facility]    Pecan Hickory IgE 11.9 (H) <0.35 kUA/L [facility]    Walnut Tree IgE 7.26 (H) <0.35 kUA/L [facility]    [place] Grass IgE 1.03 (H) <0.35 kUA/L [facility]    Goosefoot (Lamb's Quarters) IgE 1.29 (H) <0.35 kUA/L [facility]    Willow tree IgE 0.72 (H) <0.35 kUA/L [facility]     IgE Total (05/13/2020 4:30 PM EDT) Lab Results - IgE Total (05/13/2020 4:30 PM EDT) Component Value Ref Range Performed At Pathologist Signature  IgE, Total 471 (H)         Assessment & Plan:   Discussion: 53 year old with asthma, allergic rhinitis, fibromyalgia, moderate OSA not on CPAP who presents for asthma follow-up. Overall well controlled with minimal symptoms and able to tolerate daily dosing of ICS/LABA in summer months. Titrate up inhaler for symptomatic season. Discussed clinical course and management of asthma including bronchodilator regimen and action plan for exacerbation.   Moderate persistent asthma --CONTINUE Wixela 100-50 mcg ONE puff TWICE a day. Rinse mouth out after use.  --CONTINUE Albuterol  TWO puffs AS NEEDED for shortness of breath or wheezing  --ORDER pulmonary function tests. Schedule before the end of the year. Will call with results  Allergic Rhinitis --Continue flonase  daily as needed --CONTINUE Olopatadine  0.2%. Instill 1 drop into each affected eye once daily.   Health Maintenance Immunization History  Administered Date(s) Administered   Influenza, Seasonal, Injecte, Preservative Fre 09/26/2023   Influenza,inj,Quad PF,6+ Mos 10/30/2017   Moderna Covid-19 Vaccine Bivalent  Booster 36yrs & up 08/01/2021   Moderna SARS-COV2 Booster Vaccination 09/15/2020   CT Lung Screen - not qualified . Never smoker  Orders Placed This Encounter  Procedures   Pulmonary function test    Standing Status:   Future    Expiration Date:   08/16/2025    Where should this test be performed?:   Outpatient Pulmonary    What type of PFT is being ordered?:   Full PFT   No orders of the defined types were placed in this encounter.   Return in about 6 months (around 02/14/2025).  I have  spent a total time of 30-minutes on the day of the appointment including chart review, data review, collecting history, coordinating care and discussing medical diagnosis and plan with the patient/family. Past medical history, allergies, medications were reviewed. Pertinent imaging, labs and tests included in this note have been reviewed and interpreted independently by me.  Lamin Chandley Slater Staff, MD [facility] 08/16/2024 10:32 AM

## 2024-08-16 NOTE — Patient Instructions (Signed)
 Moderate persistent asthma --CONTINUE Wixela 100-50 mcg ONE puff TWICE a day. Rinse mouth out after use.  --CONTINUE Albuterol  TWO puffs AS NEEDED for shortness of breath or wheezing  --ORDER pulmonary function tests. Schedule before the end of the year. Will call with results  Allergic Rhinitis --Continue flonase  daily as needed --CONTINUE Olopatadine  0.2%. Instill 1 drop into each affected eye once daily.

## 2024-08-29 ENCOUNTER — Encounter (HOSPITAL_BASED_OUTPATIENT_CLINIC_OR_DEPARTMENT_OTHER)

## 2024-09-02 ENCOUNTER — Ambulatory Visit (INDEPENDENT_AMBULATORY_CARE_PROVIDER_SITE_OTHER)

## 2024-09-02 DIAGNOSIS — J45909 Unspecified asthma, uncomplicated: Secondary | ICD-10-CM | POA: Diagnosis not present

## 2024-09-02 LAB — PULMONARY FUNCTION TEST
DL/VA % pred: 163 %
DL/VA: 6.89 ml/min/mmHg/L
DLCO unc % pred: 123 %
DLCO unc: 26.31 ml/min/mmHg
FEF 25-75 Post: 2.95 L/s
FEF 25-75 Pre: 2.46 L/s
FEF2575-%Change-Post: 19 %
FEF2575-%Pred-Post: 112 %
FEF2575-%Pred-Pre: 94 %
FEV1-%Change-Post: 8 %
FEV1-%Pred-Post: 86 %
FEV1-%Pred-Pre: 79 %
FEV1-Post: 2.41 L
FEV1-Pre: 2.21 L
FEV1FVC-%Change-Post: 4 %
FEV1FVC-%Pred-Pre: 107 %
FEV6-%Change-Post: 4 %
FEV6-%Pred-Post: 78 %
FEV6-%Pred-Pre: 75 %
FEV6-Post: 2.72 L
FEV6-Pre: 2.61 L
FEV6FVC-%Pred-Post: 103 %
FEV6FVC-%Pred-Pre: 103 %
FVC-%Change-Post: 4 %
FVC-%Pred-Post: 76 %
FVC-%Pred-Pre: 73 %
FVC-Post: 2.72 L
FVC-Pre: 2.61 L
Post FEV1/FVC ratio: 88 %
Post FEV6/FVC ratio: 100 %
Pre FEV1/FVC ratio: 85 %
Pre FEV6/FVC Ratio: 100 %
RV % pred: 115 %
RV: 2.23 L
TLC % pred: 93 %
TLC: 4.88 L

## 2024-09-02 NOTE — Patient Instructions (Signed)
 Full PFT performed today.

## 2024-09-02 NOTE — Progress Notes (Signed)
 Full PFT performed today.

## 2024-09-03 ENCOUNTER — Ambulatory Visit: Payer: Self-pay | Admitting: Pulmonary Disease

## 2024-09-17 NOTE — Telephone Encounter (Signed)
 Can you please advise how to explain this to pt?

## 2024-09-17 NOTE — Telephone Encounter (Signed)
 The elevated DLCO refers to her lungs ability to exchange gases (oxygen/carbon dioxide) and is a positive finding in this case which supports her diagnosis of asthmatic bronchitis.  Overall it is not a cause of concern.

## 2024-11-08 ENCOUNTER — Other Ambulatory Visit (HOSPITAL_BASED_OUTPATIENT_CLINIC_OR_DEPARTMENT_OTHER): Payer: Self-pay

## 2024-11-08 MED ORDER — FLUZONE 0.5 ML IM SUSY
0.5000 mL | PREFILLED_SYRINGE | Freq: Once | INTRAMUSCULAR | 0 refills | Status: AC
  Filled 2024-11-08: qty 0.5, 1d supply, fill #0

## 2024-11-15 ENCOUNTER — Encounter (HOSPITAL_BASED_OUTPATIENT_CLINIC_OR_DEPARTMENT_OTHER): Payer: Self-pay | Admitting: Pulmonary Disease

## 2025-02-18 ENCOUNTER — Ambulatory Visit (HOSPITAL_BASED_OUTPATIENT_CLINIC_OR_DEPARTMENT_OTHER): Admitting: Pulmonary Disease
